# Patient Record
Sex: Female | Born: 1985 | Race: White | Hispanic: No | Marital: Married | Smoking: Never smoker
Health system: Southern US, Community
[De-identification: ages and names within clinical notes are randomized; demographics above are authoritative.]

## PROBLEM LIST (undated history)

## (undated) DIAGNOSIS — F419 Anxiety disorder, unspecified: Secondary | ICD-10-CM

## (undated) DIAGNOSIS — Z8619 Personal history of other infectious and parasitic diseases: Secondary | ICD-10-CM

## (undated) DIAGNOSIS — J45909 Unspecified asthma, uncomplicated: Secondary | ICD-10-CM

## (undated) DIAGNOSIS — R87629 Unspecified abnormal cytological findings in specimens from vagina: Secondary | ICD-10-CM

## (undated) DIAGNOSIS — F329 Major depressive disorder, single episode, unspecified: Secondary | ICD-10-CM

## (undated) DIAGNOSIS — G43909 Migraine, unspecified, not intractable, without status migrainosus: Secondary | ICD-10-CM

## (undated) DIAGNOSIS — Z5189 Encounter for other specified aftercare: Secondary | ICD-10-CM

## (undated) DIAGNOSIS — D649 Anemia, unspecified: Secondary | ICD-10-CM

## (undated) DIAGNOSIS — J4530 Mild persistent asthma, uncomplicated: Secondary | ICD-10-CM

## (undated) DIAGNOSIS — S060X9A Concussion with loss of consciousness of unspecified duration, initial encounter: Secondary | ICD-10-CM

## (undated) DIAGNOSIS — S060XAA Concussion with loss of consciousness status unknown, initial encounter: Secondary | ICD-10-CM

## (undated) DIAGNOSIS — F32A Depression, unspecified: Secondary | ICD-10-CM

## (undated) HISTORY — DX: Anxiety disorder, unspecified: F41.9

## (undated) HISTORY — PX: LUMBAR PUNCTURE: SHX1985

## (undated) HISTORY — DX: Mild persistent asthma, uncomplicated: J45.30

## (undated) HISTORY — DX: Concussion with loss of consciousness status unknown, initial encounter: S06.0XAA

## (undated) HISTORY — DX: Personal history of other infectious and parasitic diseases: Z86.19

## (undated) HISTORY — DX: Unspecified abnormal cytological findings in specimens from vagina: R87.629

## (undated) HISTORY — DX: Concussion with loss of consciousness of unspecified duration, initial encounter: S06.0X9A

## (undated) HISTORY — DX: Major depressive disorder, single episode, unspecified: F32.9

## (undated) HISTORY — DX: Migraine, unspecified, not intractable, without status migrainosus: G43.909

## (undated) HISTORY — DX: Anemia, unspecified: D64.9

---
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
## 2012-12-13 ENCOUNTER — Encounter: Payer: Self-pay | Admitting: *Deleted

## 2012-12-13 ENCOUNTER — Emergency Department
Admission: EM | Admit: 2012-12-13 | Discharge: 2012-12-13 | Disposition: A | Discharge status: Home / Self Care | Payer: BC Managed Care – PPO | Source: Home / Self Care

## 2012-12-13 ENCOUNTER — Emergency Department (INDEPENDENT_AMBULATORY_CARE_PROVIDER_SITE_OTHER): Payer: BC Managed Care – PPO

## 2012-12-13 DIAGNOSIS — S93401A Sprain of unspecified ligament of right ankle, initial encounter: Secondary | ICD-10-CM

## 2012-12-13 DIAGNOSIS — S93409A Sprain of unspecified ligament of unspecified ankle, initial encounter: Secondary | ICD-10-CM

## 2012-12-13 DIAGNOSIS — M25579 Pain in unspecified ankle and joints of unspecified foot: Secondary | ICD-10-CM

## 2012-12-13 HISTORY — DX: Unspecified asthma, uncomplicated: J45.909

## 2012-12-13 HISTORY — DX: Encounter for other specified aftercare: Z51.89

## 2012-12-13 MED ORDER — MELOXICAM 15 MG PO TABS: 15.0000 mg | ORAL_TABLET | Freq: Every day | ORAL | Status: DC

## 2012-12-13 NOTE — ED Notes (Signed)
Pt c/o RT ankle injury x 6 days ago. No OTC meds.

## 2012-12-13 NOTE — ED Provider Notes (Signed)
CSN: [id number]     Arrival date & time 12/13/12  1703 History     None    Chief Complaint  Patient presents with  . Ankle Injury    HPI   R ankle pain x 1 week  Pt accidentally rolled ankle last weekend.  Reinjured same ankle 1-2 days later.  Had some mild swelling and redness/bruising.  Pain predominantly medial sided Has been able to ambulate.   Past Medical History  Diagnosis Date  . Asthma   . Blood transfusion without reported diagnosis    Past Surgical History  Procedure Laterality Date  . Mouth surgery    . Lumbar puncture     Family History  Problem Relation Age of Onset  . Asthma Mother    History  Substance Use Topics  . Smoking status: Never Smoker   . Smokeless tobacco: Not on file  . Alcohol Use: Yes   OB History   Grav Para Term Preterm Abortions TAB SAB Ect Mult Living                 Review of Systems  All other systems reviewed and are negative.    Allergies  Influenza vaccines and Sulfa antibiotics  Home Medications   Current Outpatient Rx  Name  Route  Sig  Dispense  Refill  . albuterol (PROVENTIL HFA;VENTOLIN HFA) 108 (90 BASE) MCG/ACT inhaler   Inhalation   Inhale 2 puffs into the lungs every 6 (six) hours as needed for wheezing.         Marland Kitchen escitalopram (LEXAPRO) 10 MG tablet   Oral   Take 10 mg by mouth daily.         Marland Kitchen UNKNOWN TO PATIENT                BP 115/74  Pulse 71  Temp(Src) 98.1 F (36.7 C) (Oral)  Resp 16  Ht 5\' 6"  (1.676 m)  Wt 155 lb (70.308 kg)  BMI 25.03 kg/m2  SpO2 99%  LMP 11/29/2012 Physical Exam  Constitutional: She appears well-developed and well-nourished.  HENT:  Head: Normocephalic and atraumatic.  Eyes: Conjunctivae are normal. Pupils are equal, round, and reactive to light.  Neck: Normal range of motion.  Cardiovascular: Normal rate and regular rhythm.   Pulmonary/Chest: Effort normal and breath sounds normal.  Abdominal: Soft.  Musculoskeletal: Normal range of motion.   Feet:  Neurological: She is alert.    ED Course   Procedures (including critical care time)  Labs Reviewed - No data to display Dg Ankle Complete Right  12/13/2012   *RADIOLOGY REPORT*  Clinical Data: . Right ankle injury and pain  RIGHT ANKLE - COMPLETE 3+ VIEW  Comparison: None  Findings: There is no evidence of acute fracture, subluxation or dislocation. The ankle mortise is intact. The talar dome is unremarkable. No focal bony lesions are present. There is no evidence of ankle effusion.  IMPRESSION: No evidence of acute abnormality.   Original Report Authenticated By: Harmon Pier, M.D.   1. Right ankle sprain, initial encounter     MDM  xrays negative for fracture.  RICE and NSAIDs.  Ankle brace.  Discussed general care and MSK red flags. Follow up as needed.      The patient and/or caregiver has been counseled thoroughly with regard to treatment plan and/or medications prescribed including dosage, schedule, interactions, rationale for use, and possible side effects and they verbalize understanding. Diagnoses and expected course of recovery discussed and will return if not  improved as expected or if the condition worsens. Patient and/or caregiver verbalized understanding.        Doree Albee, MD 12/13/12 1800

## 2013-01-21 ENCOUNTER — Emergency Department
Admission: EM | Admit: 2013-01-21 | Discharge: 2013-01-21 | Disposition: A | Discharge status: Home / Self Care | Payer: BC Managed Care – PPO | Source: Home / Self Care | Attending: Family Medicine | Admitting: Family Medicine

## 2013-01-21 ENCOUNTER — Encounter: Payer: Self-pay | Admitting: *Deleted

## 2013-01-21 DIAGNOSIS — Z202 Contact with and (suspected) exposure to infections with a predominantly sexual mode of transmission: Secondary | ICD-10-CM

## 2013-01-21 DIAGNOSIS — N3000 Acute cystitis without hematuria: Secondary | ICD-10-CM

## 2013-01-21 DIAGNOSIS — R300 Dysuria: Secondary | ICD-10-CM

## 2013-01-21 DIAGNOSIS — Z9189 Other specified personal risk factors, not elsewhere classified: Secondary | ICD-10-CM

## 2013-01-21 HISTORY — DX: Anxiety disorder, unspecified: F41.9

## 2013-01-21 HISTORY — DX: Depression, unspecified: F32.A

## 2013-01-21 HISTORY — DX: Major depressive disorder, single episode, unspecified: F32.9

## 2013-01-21 LAB — POCT URINALYSIS DIP (MANUAL ENTRY)
Protein Ur, POC: 30
Spec Grav, UA: 1.025 (ref 1.005–1.03)
Urobilinogen, UA: 1 (ref 0–1)

## 2013-01-21 MED ORDER — NITROFURANTOIN MONOHYD MACRO 100 MG PO CAPS: 100.0000 mg | ORAL_CAPSULE | Freq: Two times a day (BID) | ORAL | Status: DC

## 2013-01-21 NOTE — ED Notes (Signed)
Pt c/o dysuria and frequent urination x 1 day. Denies d/c. She reports that she did have unprotected sex this weekend.

## 2013-01-21 NOTE — ED Provider Notes (Signed)
CSN: [id number]     Arrival date & time 01/21/13  1755 History   First MD Initiated Contact with Patient 01/21/13 1819     Chief Complaint  Patient presents with  . Urinary Frequency  . Abdominal Pain     HPI Comments: Patient complains of dysuria and frequency for two days.  No nocturia, abdominal pain, nausea, or fever.  No pelvic pain or vaginal discharge.  She had unprotected intercourse 3 days ago and is concerned about the possibility of STD.   Patient's last menstrual period was 12/22/2012.   Patient is a 27 y.o. female presenting with dysuria. The history is provided by the patient.  Dysuria Pain quality:  Burning Pain severity:  Mild Onset quality:  Sudden Duration:  2 days Timing:  Constant Progression:  Worsening Chronicity:  New Recent urinary tract infections: no   Relieved by:  Nothing Worsened by:  Nothing tried Ineffective treatments:  None tried Urinary symptoms: foul-smelling urine, frequent urination and hesitancy   Urinary symptoms: no discolored urine, no hematuria and no bladder incontinence   Associated symptoms: no abdominal pain, no fever, no flank pain, no genital lesions, no nausea, no vaginal discharge and no vomiting   Risk factors: sexually active     Past Medical History  Diagnosis Date  . Asthma   . Blood transfusion without reported diagnosis   . Anxiety   . Depression    Past Surgical History  Procedure Laterality Date  . Mouth surgery    . Lumbar puncture     Family History  Problem Relation Age of Onset  . Asthma Mother    History  Substance Use Topics  . Smoking status: Never Smoker   . Smokeless tobacco: Not on file  . Alcohol Use: Yes   OB History   Grav Para Term Preterm Abortions TAB SAB Ect Mult Living                 Review of Systems  Constitutional: Negative for fever.  Gastrointestinal: Negative for nausea, vomiting and abdominal pain.  Genitourinary: Positive for dysuria. Negative for flank pain and vaginal  discharge.  All other systems reviewed and are negative.    Allergies  Influenza vaccines and Sulfa antibiotics  Home Medications   Current Outpatient Rx  Name  Route  Sig  Dispense  Refill  . albuterol (PROVENTIL HFA;VENTOLIN HFA) 108 (90 BASE) MCG/ACT inhaler   Inhalation   Inhale 2 puffs into the lungs every 6 (six) hours as needed for wheezing.         Marland Kitchen escitalopram (LEXAPRO) 10 MG tablet   Oral   Take 10 mg by mouth daily.         . meloxicam (MOBIC) 15 MG tablet   Oral   Take 1 tablet (15 mg total) by mouth daily.   30 tablet   1   . nitrofurantoin, macrocrystal-monohydrate, (MACROBID) 100 MG capsule   Oral   Take 1 capsule (100 mg total) by mouth 2 (two) times daily.   14 capsule   0   . UNKNOWN TO PATIENT                BP 123/82  Pulse 82  Temp(Src) 98.2 F (36.8 C) (Oral)  Resp 16  Ht 5\' 6"  (1.676 m)  Wt 154 lb (69.854 kg)  BMI 24.87 kg/m2  SpO2 100%  LMP 12/22/2012 Physical Exam Nursing notes and Vital Signs reviewed. Appearance:  Patient appears healthy, stated age, and in  no acute distress Eyes:  Pupils are equal, round, and reactive to light and accomodation.  Extraocular movement is intact.  Conjunctivae are not inflamed  Pharynx:  Normal Neck:  Supple.  No adenopathy Lungs:  Clear to auscultation.  Breath sounds are equal.  Heart:  Regular rate and rhythm without murmurs, rubs, or gallops.  Abdomen:  Nontender without masses or hepatosplenomegaly.  Bowel sounds are present.  No CVA or flank tenderness.  Extremities:  No edema.  No calf tenderness Skin:  No rash present.   ED Course  Procedures  None  Labs Review Labs Reviewed  URINE CULTURE  GC/CHLAMYDIA PROBE AMP, GENITAL  HIV ANTIBODY (ROUTINE TESTING)  POCT URINALYSIS DIP (MANUAL ENTRY) BLO trace intact; PRO 30mg /dL; LEU moderate       MDM   1. Dysuria   2. Acute cystitis   3. Possible exposure to STD    GC/chlamydia pending.  Patient feels there no risk of HIV  exposure this past week.  Will check HIV antibodies (last HIV testing about one year ago)  Urine culture pending.  Begin Macrobid. Increase fluid intake.  May take AZO for two days if desired.    Lattie Haw, MD 01/25/13 1900

## 2013-01-22 LAB — GC/CHLAMYDIA PROBE AMP
CT Probe RNA: NEGATIVE
GC Probe RNA: NEGATIVE

## 2013-01-23 ENCOUNTER — Telehealth: Payer: Self-pay | Admitting: *Deleted

## 2013-05-12 ENCOUNTER — Ambulatory Visit (INDEPENDENT_AMBULATORY_CARE_PROVIDER_SITE_OTHER): Payer: BC Managed Care – PPO | Admitting: Family Medicine

## 2013-05-12 ENCOUNTER — Encounter: Payer: Self-pay | Admitting: Family Medicine

## 2013-05-12 VITALS — BP 120/78 | HR 89 | Temp 98.1°F | Ht 66.0 in | Wt 158.0 lb

## 2013-05-12 DIAGNOSIS — F419 Anxiety disorder, unspecified: Secondary | ICD-10-CM

## 2013-05-12 DIAGNOSIS — F32A Depression, unspecified: Secondary | ICD-10-CM | POA: Insufficient documentation

## 2013-05-12 DIAGNOSIS — J45901 Unspecified asthma with (acute) exacerbation: Secondary | ICD-10-CM

## 2013-05-12 DIAGNOSIS — J4530 Mild persistent asthma, uncomplicated: Secondary | ICD-10-CM

## 2013-05-12 DIAGNOSIS — F329 Major depressive disorder, single episode, unspecified: Secondary | ICD-10-CM | POA: Insufficient documentation

## 2013-05-12 DIAGNOSIS — F341 Dysthymic disorder: Secondary | ICD-10-CM

## 2013-05-12 HISTORY — DX: Mild persistent asthma, uncomplicated: J45.30

## 2013-05-12 HISTORY — DX: Depression, unspecified: F32.A

## 2013-05-12 HISTORY — DX: Anxiety disorder, unspecified: F41.9

## 2013-05-12 MED ORDER — ESCITALOPRAM OXALATE 10 MG PO TABS: 10.0000 mg | ORAL_TABLET | Freq: Every day | ORAL | Status: DC

## 2013-05-12 MED ORDER — DOXYCYCLINE HYCLATE 100 MG PO TABS: ORAL_TABLET | ORAL | Status: DC

## 2013-05-12 MED ORDER — PREDNISONE 20 MG PO TABS: ORAL_TABLET | ORAL | Status: DC

## 2013-05-12 MED ORDER — PREDNISONE 20 MG PO TABS: ORAL_TABLET | ORAL | Status: AC

## 2013-05-12 NOTE — Progress Notes (Signed)
CC: Kim Rodriguez is a 27 y.o. 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 Symptoms are slightly improved with albuterol.  Has also tried humidifier without much benefit at home. No other interventions as of yet. Reports a history of long-standing asthma has been on controller medications in the past but admits to trying to use them as infrequently as possible.  Cough is described as nonproductive with little to no wheeze. Accompanied by shortness of breath which is mild in severity and chest tightness which is not related to exertion.  Complains of remote history of anxiety and depression that was worsened about a year ago after the passing of her father. She stopped taking Lexapro approximately 2-3 months ago to see if she needed to really be on. Since then her husband and friends have noticed she has a more gloomy outlook on life and has significantly lost interest in social activity. Patient denies thoughts of wanting to harm self or others but does agree with others observations described above. Reports objective depression mild to moderate in severity.  Review of Systems - General ROS: negative for - chills, fever, night sweats, weight gain or weight loss Ophthalmic ROS: negative for - decreased vision Psychological ROS: negative for - paranoia ENT ROS: negative for - hearing change, nasal congestion, tinnitus or allergies Hematological and Lymphatic ROS: negative for - bleeding problems, bruising or swollen lymph nodes Breast ROS: negative Respiratory ROS: Positive for cough shortness of breath and wheezing Cardiovascular ROS: Denies exertional chest pain Gastrointestinal ROS: no abdominal pain, change in bowel habits, or black or bloody  stools Genito-Urinary ROS: negative for - genital discharge, genital ulcers, incontinence or abnormal bleeding from genitals Musculoskeletal ROS: negative for - joint pain or muscle pain Neurological ROS: negative for - headaches or memory loss Dermatological ROS: negative for lumps, mole changes, rash and skin lesion changes  Past Medical History  Diagnosis Date  . Asthma   . Blood transfusion without reported diagnosis   . Anxiety   . Depression   . Asthma, mild persistent 05/12/2013  . Anxiety and depression 05/12/2013     Family History  Problem Relation Age of Onset  . Asthma Mother   . Skin cancer Mother      History  Substance Use Topics  . Smoking status: Never Smoker   . Smokeless tobacco: Not on file  . Alcohol Use: 0.0 oz/week    0 drink(s) per week     Comment: 1 drink a month     Objective: Filed Vitals:   05/12/13 1311  BP: 120/78  Pulse: 89  Temp: 98.1 F (36.7 C)    General: Alert and Oriented, No Acute Distress HEENT: Pupils equal, round, reactive to light. Conjunctivae clear.  External ears unremarkable, canals clear with intact TMs with appropriate landmarks.  Middle ear appears open without effusion. Pink inferior turbinates.  Moist mucous membranes, pharynx without inflammation nor lesions.  Neck supple without palpable lymphadenopathy nor abnormal masses. Lungs: Clear to auscultation bilaterally, no rhonchi or rails. There is trace end expiratory wheezing. Comfortable work of breathing. Good air movement. Cardiac: Regular rate and rhythm. Normal S1/S2.  No murmurs, rubs, nor gallops.   Mental Status: No depression, anxiety, nor agitation. Skin:  Warm and dry.  Assessment & Plan: Kim Rodriguez was seen today for establish care and cough.  Diagnoses and associated orders for this visit:  Anxiety and depression - Discontinue: escitalopram (LEXAPRO) 10 MG tablet; Take 1 tablet (10 mg total) by mouth daily. - escitalopram (LEXAPRO) 10 MG tablet; Take 1  tablet (10 mg total) by mouth daily.  Asthma, mild persistent  Asthma with acute exacerbation - Discontinue: doxycycline (VIBRA-TABS) 100 MG tablet; One by mouth twice a day for ten days. - Discontinue: predniSONE (DELTASONE) 20 MG tablet; Three tabs at once daily for five days. - doxycycline (VIBRA-TABS) 100 MG tablet; One by mouth twice a day for ten days. - predniSONE (DELTASONE) 20 MG tablet; Three tabs at once daily for five days.    Anxiety and depression: Uncontrolled restart Lexapro followup 1 month Asthma: Uncontrolled due to acute exacerbation therefore start prednisone and doxycycline. Return once feeling better from a pulmonary standpoint to discuss controller medication such as ICS  Return in about 4 weeks (around 06/09/2013).

## 2013-06-15 ENCOUNTER — Emergency Department
Admission: EM | Admit: 2013-06-15 | Discharge: 2013-06-15 | Disposition: A | Discharge status: Home / Self Care | Payer: 59 | Source: Home / Self Care | Attending: Family Medicine | Admitting: Family Medicine

## 2013-06-15 ENCOUNTER — Encounter: Payer: Self-pay | Admitting: Emergency Medicine

## 2013-06-15 DIAGNOSIS — R69 Illness, unspecified: Principal | ICD-10-CM

## 2013-06-15 DIAGNOSIS — J111 Influenza due to unidentified influenza virus with other respiratory manifestations: Secondary | ICD-10-CM

## 2013-06-15 MED ORDER — BENZONATATE 200 MG PO CAPS: 200.0000 mg | ORAL_CAPSULE | Freq: Every day | ORAL | Status: DC

## 2013-06-15 MED ORDER — OSELTAMIVIR PHOSPHATE 75 MG PO CAPS: 75.0000 mg | ORAL_CAPSULE | Freq: Two times a day (BID) | ORAL | Status: DC

## 2013-06-15 NOTE — ED Notes (Signed)
Kim Rodriguez complains of body aches, dizziness, eye pain, headaches, runny nose, sneezing, congestion, cough, shortness on breath and nausea for 3 days. Denies fever, chills or sweats. Kim Rodriguez does however state a worsening of symptoms daily.

## 2013-06-15 NOTE — Discharge Instructions (Signed)
Take plain Mucinex (1200 mg guaifenesin) twice daily for cough and congestion.  May add Sudafed for sinus congestion.   Increase fluid intake, rest. May use Afrin nasal spray (or generic oxymetazoline) twice daily for about 5 days.  Also recommend using saline nasal spray several times daily and saline nasal irrigation (AYR is a common brand) Continue albuterol inhaler as needed. Try warm salt water gargles for sore throat.  Stop all antihistamines for now, and other non-prescription cough/cold preparations. May take Ibuprofen 200mg , 4 tabs every 8 hours with food for chest/sternum discomfort, fever, etc.     Influenza, Adult Influenza ("the flu") is a viral infection of the respiratory tract. It occurs more often in winter months because people spend more time in close contact with one another. Influenza can make you feel very sick. Influenza easily spreads from person to person (contagious). CAUSES  Influenza is caused by a virus that infects the respiratory tract. You can catch the virus by breathing in droplets from an infected person's cough or sneeze. You can also catch the virus by touching something that was recently contaminated with the virus and then touching your mouth, nose, or eyes. SYMPTOMS  Symptoms typically last 4 to 10 days and may include:  Fever.  Chills.  Headache, body aches, and muscle aches.  Sore throat.  Chest discomfort and cough.  Poor appetite.  Weakness or feeling tired.  Dizziness.  Nausea or vomiting. DIAGNOSIS  Diagnosis of influenza is often made based on your history and a physical exam. A nose or throat swab test can be done to confirm the diagnosis. RISKS AND COMPLICATIONS You may be at risk for a more severe case of influenza if you smoke cigarettes, have diabetes, have chronic heart disease (such as heart failure) or lung disease (such as asthma), or if you have a weakened immune system. Elderly people and pregnant women are also at risk for  more serious infections. The most common complication of influenza is a lung infection (pneumonia). Sometimes, this complication can require emergency medical care and may be life-threatening. PREVENTION  An annual influenza vaccination (flu shot) is the best way to avoid getting influenza. An annual flu shot is now routinely recommended for all adults in the [place] TREATMENT  In mild cases, influenza goes away on its own. Treatment is directed at relieving symptoms. For more severe cases, your caregiver may prescribe antiviral medicines to shorten the sickness. Antibiotic medicines are not effective, because the infection is caused by a virus, not by bacteria. HOME CARE INSTRUCTIONS  Only take over-the-counter or prescription medicines for pain, discomfort, or fever as directed by your caregiver.  Use a cool mist humidifier to make breathing easier.  Get plenty of rest until your temperature returns to normal. This usually takes 3 to 4 days.  Drink enough fluids to keep your urine clear or pale yellow.  Cover your mouth and nose when coughing or sneezing, and wash your hands well to avoid spreading the virus.  Stay home from work or school until your fever has been gone for at least 1 full day. SEEK MEDICAL CARE IF:   You have chest pain or a deep cough that worsens or produces more mucus.  You have nausea, vomiting, or diarrhea. SEEK IMMEDIATE MEDICAL CARE IF:   You have difficulty breathing, shortness of breath, or your skin or nails turn bluish.  You have severe neck pain or stiffness.  You have a severe headache, facial pain, or earache.  You have  a worsening or recurring fever.  You have nausea or vomiting that cannot be controlled. MAKE SURE YOU:  Understand these instructions.  Will watch your condition.  Will get help right away if you are not doing well or get worse. Document Released: 04/28/2000 Document Revised: 10/31/2011 Document Reviewed: 07/31/2011 [facility]  Patient Information 2014 [place], [place].

## 2013-06-15 NOTE — ED Provider Notes (Signed)
CSN: [id number]     Arrival date & time 06/15/13  1541 History   First MD Initiated Contact with Patient 06/15/13 1600     Chief Complaint  Patient presents with  . Generalized Body Aches    x 3 days  . Headache    x 3 days  . Cough    x 3 days      HPI Comments: Three days ago patient nasal congestion and fatigue.  The next day she developed myalgias, chills/sweats, cough, nasal congestion, nausea, and anorexia.  The history is provided by the patient.    Past Medical History  Diagnosis Date  . Asthma   . Blood transfusion without reported diagnosis   . Anxiety   . Depression   . Asthma, mild persistent 05/12/2013  . Anxiety and depression 05/12/2013   Past Surgical History  Procedure Laterality Date  . Mouth surgery  2008  . Lumbar puncture      pt had a blood transfusion in 1991   Family History  Problem Relation Age of Onset  . Asthma Mother   . Skin cancer Mother   . Hyperlipidemia Mother    History  Substance Use Topics  . Smoking status: Never Smoker   . Smokeless tobacco: Not on file  . Alcohol Use: 0.0 oz/week    0 drink(s) per week     Comment: 1 drink a month   OB History   Grav Para Term Preterm Abortions TAB SAB Ect Mult Living                 Review of Systems No sore throat + cough No pleuritic pain No wheezing + nasal congestion + post-nasal drainage No sinus pain/pressure ? itchy/red eyes No earache No hemoptysis + SOB No fever, + chills + nausea No vomiting No abdominal pain No diarrhea No urinary symptoms No skin rash + fatigue + myalgias + headache Used OTC meds without relief  Allergies  Influenza vaccines and Sulfa antibiotics  Home Medications   Current Outpatient Rx  Name  Route  Sig  Dispense  Refill  . albuterol (PROVENTIL HFA;VENTOLIN HFA) 108 (90 BASE) MCG/ACT inhaler   Inhalation   Inhale 2 puffs into the lungs every 6 (six) hours as needed for wheezing.         . benzonatate (TESSALON) 200 MG capsule   Oral   Take 1 capsule (200 mg total) by mouth at bedtime. Take as needed for cough   12 capsule   0   . escitalopram (LEXAPRO) 10 MG tablet   Oral   Take 1 tablet (10 mg total) by mouth daily.   90 tablet   1   . oseltamivir (TAMIFLU) 75 MG capsule   Oral   Take 1 capsule (75 mg total) by mouth every 12 (twelve) hours.   10 capsule   0    BP 130/82  Pulse 108  Temp(Src) 98 F (36.7 C) (Oral)  Ht 5\' 6"  (1.676 m)  Wt 156 lb (70.761 kg)  BMI 25.19 kg/m2  SpO2 97%  LMP 05/27/2013 Physical Exam Nursing notes and Vital Signs reviewed. Appearance:  Patient appears healthy, stated age, and in no acute distress Eyes:  Pupils are equal, round, and reactive to light and accomodation.  Extraocular movement is intact.  Conjunctivae are not inflamed  Ears:  Canals normal.  Tympanic membranes normal.  Nose:  Mildly congested turbinates.  No sinus tenderness.    Pharynx:  Normal Neck:  Supple.  Tender shotty posterior nodes are palpated bilaterally  Lungs:  Clear to auscultation.  Breath sounds are equal. Chest:  Distinct tenderness to palpation over the mid-sternum.   Heart:  Regular rate and rhythm without murmurs, rubs, or gallops.  Abdomen:  Nontender without masses or hepatosplenomegaly.  Bowel sounds are present.  No CVA or flank tenderness.  Extremities:  No edema.  No calf tenderness Skin:  No rash present.   ED Course  Procedures  none       MDM   1. Influenza-like illness    Begin Tamiflu.  Prescription written for Benzonatate [facility](Tessalon) to take at bedtime for night-time cough.  Take plain Mucinex (1200 mg guaifenesin) twice daily for cough and congestion.  May add Sudafed for sinus congestion.   Increase fluid intake, rest. May use Afrin nasal spray (or generic oxymetazoline) twice daily for about 5 days.  Also recommend using saline nasal spray several times daily and saline nasal irrigation (AYR is a common brand) Continue albuterol inhaler as needed. Try warm  salt water gargles for sore throat.  Stop all antihistamines for now, and other non-prescription cough/cold preparations. May take Ibuprofen 200mg , 4 tabs every 8 hours with food for chest/sternum discomfort, fever, etc. Followup with Family Doctor if not improved in 5 days.    Lattie HawStephen A Jerral Mccauley, MD 06/16/13 [id number]

## 2013-07-02 ENCOUNTER — Encounter: Payer: Self-pay | Admitting: Emergency Medicine

## 2013-07-02 ENCOUNTER — Emergency Department
Admission: EM | Admit: 2013-07-02 | Discharge: 2013-07-02 | Disposition: A | Discharge status: Home / Self Care | Payer: 59 | Source: Home / Self Care | Attending: Family Medicine | Admitting: Family Medicine

## 2013-07-02 DIAGNOSIS — H579 Unspecified disorder of eye and adnexa: Secondary | ICD-10-CM

## 2013-07-02 NOTE — Discharge Instructions (Signed)
Apply cool compress several times daily

## 2013-07-02 NOTE — ED Provider Notes (Signed)
CSN: [id number]     Arrival date & time 07/02/13  1711 History   First MD Initiated Contact with Patient 07/02/13 1826     Chief Complaint  Patient presents with  . Eye Problem        HPI Comments: Patient noticed a vague foreign body sensation in her right eye last night, and she has had some mucoid discharge present.  She denies redness.  Patient is a 28 y.o. female presenting with eye problem. The history is provided by the patient.  Eye Problem Quality:  Foreign body sensation Severity:  Mild Onset quality:  Sudden Duration:  1 day Timing:  Constant Progression:  Unchanged Chronicity:  New Relieved by:  Nothing Worsened by:  Eye movement Ineffective treatments:  None tried Associated symptoms: discharge and foreign body sensation   Associated symptoms: no blurred vision, no crusting, no decreased vision, no double vision, no facial rash, no headaches, no inflammation, no itching, no nausea, no photophobia, no redness, no scotomas, no swelling and no tearing   Risk factors: not exposed to pinkeye and no recent URI     Past Medical History  Diagnosis Date  . Asthma   . Blood transfusion without reported diagnosis   . Anxiety   . Depression   . Asthma, mild persistent 05/12/2013  . Anxiety and depression 05/12/2013   Past Surgical History  Procedure Laterality Date  . Mouth surgery  2008  . Lumbar puncture      pt had a blood transfusion in 1991   Family History  Problem Relation Age of Onset  . Asthma Mother   . Skin cancer Mother   . Hyperlipidemia Mother    History  Substance Use Topics  . Smoking status: Never Smoker   . Smokeless tobacco: Not on file  . Alcohol Use: No     Comment: 1 drink a month   OB History   Grav Para Term Preterm Abortions TAB SAB Ect Mult Living                 Review of Systems  Eyes: Positive for discharge. Negative for blurred vision, double vision, photophobia, redness and itching.  Gastrointestinal: Negative for nausea.   Neurological: Negative for headaches.      Allergies  Influenza vaccines and Sulfa antibiotics  Home Medications   Current Outpatient Rx  Name  Route  Sig  Dispense  Refill  . albuterol (PROVENTIL HFA;VENTOLIN HFA) 108 (90 BASE) MCG/ACT inhaler   Inhalation   Inhale 2 puffs into the lungs every 6 (six) hours as needed for wheezing.         . benzonatate (TESSALON) 200 MG capsule   Oral   Take 1 capsule (200 mg total) by mouth at bedtime. Take as needed for cough   12 capsule   0   . escitalopram (LEXAPRO) 10 MG tablet   Oral   Take 1 tablet (10 mg total) by mouth daily.   90 tablet   1   . oseltamivir (TAMIFLU) 75 MG capsule   Oral   Take 1 capsule (75 mg total) by mouth every 12 (twelve) hours.   10 capsule   0    BP 116/85  Pulse 97  Temp(Src) 98 F (36.7 C) (Oral)  Resp 16  Ht 5\' 8"  (1.727 m)  Wt 157 lb (71.215 kg)  BMI 23.88 kg/m2  SpO2 100%  LMP 06/30/2013 Physical Exam Nursing notes and Vital Signs reviewed. Appearance:  Patient appears healthy, stated age,  and in no acute distress Eyes:  Pupils are equal, round, and reactive to light and accomodation.  Extraocular movement is intact.  Conjunctivae are not inflamed.  No photophobia.  Fundi benign.  No lid swelling or tenderness.  Tetracaine ophthalmic suspension instilled to right eye.  Patient will not allow complete eversion of upper eyelid but area visualized reveals no foreign body.  Fluorescein shows no uptake.  Skin:  No rash present.   ED Course  Procedures  none       MDM   Final diagnoses:  Sensation of foreign body in eye.  Patient will not allow thorough eye exam.  Limited exam, including fluorescein application, reveals no foreign body or corneal abrasion/defect.    Apply cool compress several times daily. Followup with ophthalmologist if not improved 3 days    Lattie Haw, MD 07/04/13 [phone]

## 2013-07-02 NOTE — ED Notes (Signed)
Pt c/o RT eye pain with sticky drainage x last night. Denies itching and redness.

## 2013-07-03 ENCOUNTER — Encounter: Payer: Self-pay | Admitting: Sports Medicine

## 2013-07-03 ENCOUNTER — Ambulatory Visit (INDEPENDENT_AMBULATORY_CARE_PROVIDER_SITE_OTHER): Payer: 59 | Admitting: Sports Medicine

## 2013-07-03 ENCOUNTER — Telehealth: Payer: Self-pay | Admitting: *Deleted

## 2013-07-03 ENCOUNTER — Ambulatory Visit (INDEPENDENT_AMBULATORY_CARE_PROVIDER_SITE_OTHER): Payer: 59

## 2013-07-03 VITALS — BP 123/74 | HR 93 | Ht 66.0 in | Wt 157.0 lb

## 2013-07-03 DIAGNOSIS — H00039 Abscess of eyelid unspecified eye, unspecified eyelid: Secondary | ICD-10-CM

## 2013-07-03 DIAGNOSIS — H5789 Other specified disorders of eye and adnexa: Secondary | ICD-10-CM

## 2013-07-03 DIAGNOSIS — L03213 Periorbital cellulitis: Secondary | ICD-10-CM

## 2013-07-03 MED ORDER — DOXYCYCLINE HYCLATE 100 MG PO TABS: 100.0000 mg | ORAL_TABLET | Freq: Two times a day (BID) | ORAL | Status: DC

## 2013-07-03 MED ORDER — IOHEXOL 300 MG/ML  SOLN
80.0000 mL | Freq: Once | INTRAMUSCULAR | Status: AC | PRN
  Administered 2013-07-03: 80 mL via INTRAVENOUS

## 2013-07-03 NOTE — Progress Notes (Signed)
  Subjective:    CC: Right eye swelling  HPI: Irving Burtonmily is a very pleasant 28 year old female, for the past several days she's had increasing pain and swelling that she localizes around her right eye, she has not noticed any injection or conjunctiva. Symptoms are moderate, worsening. She did have a preceding sinus infection.  Past medical history, Surgical history, Family history not pertinant except as noted below, Social history, Allergies, and medications have been entered into the medical record, reviewed, and no changes needed.   Review of Systems: No fevers, chills, night sweats, weight loss, chest pain, or shortness of breath.   Objective:    General: Well Developed, well nourished, and in no acute distress.  Neuro: Alert and oriented x3, extra-ocular muscles intact, sensation grossly intact.  HEENT: Normocephalic, atraumatic, pupils equal round reactive to light, neck supple, no masses, no lymphadenopathy, thyroid nonpalpable. The lids are swollen and erythematous around the right eye, there is no conjunctival injection, she does have significant pain with motion of her for ocular muscles. Skin: Warm and dry, no rashes. Cardiac: Regular rate and rhythm, no murmurs rubs or gallops, no lower extremity edema.  Respiratory: Clear to auscultation bilaterally. Not using accessory muscles, speaking in full sentences.  CT scan was personally reviewed and does show preseptal cellulitis but no evidence of orbital cellulitis or collection.  Impression and Recommendations:

## 2013-07-03 NOTE — Assessment & Plan Note (Signed)
My suspicion is for preseptal cellulitis which we will treat with 14 days of doxycycline, she did have the classic preceding upper respiratory infection, this likely seeded from her sinuses. Unfortunately she does have significant pain with extraocular motion which makes is worried about a orbital cellulitis. I am going to obtain a stat maxillofacial CT scan with IV contrast.

## 2013-07-03 NOTE — Telephone Encounter (Signed)
Called [employer] and no precert required for CT Orbits w/ contrast.  Victorino DikeJennifer notified.  Pt sent to imaging to have stat CT.  Barry DienesKimberly Kilan Banfill, LPN

## 2013-07-03 NOTE — Patient Instructions (Signed)
Orbital Cellulitis   Orbital cellulitis is an infection of the soft tissue of the orbit without abscess formation. The eye socket is the area around and behind the eye. It usually comes on suddenly in children, but can happen at any age. This condition can lead to death if untreated.   CAUSES    A germ (bacterial) infection of the area around and behind the eye.   Long-term (chronic) sinus infections.   An object (foreign body) stuck behind the eye.   An injury that goes through (penetrates) to tissues of the eyelids.   Trauma with secondary infection.   Fracture of the boney wall or floor of the orbit.   Serious eyelid infections.   Bite wounds.   Inflammation or infection of the lining membranes of the brain (meningitis).   Blood poisoning or infection (septicemia).   Dental area filled with pus (abscesses).   Severe uncontrolled diabetes (ketoacidosis).  SYMPTOMS    Pain in the eye.   Redness and puffiness (swelling) of the eyelids. The swelling is often bad enough that the eye has to close.   Fever and feeling generally ill.   The lids and the cheek may be very red, hot and swollen.   A drop in vision.   Pain when touching the area around the eye.   The eye itself may look like it is "popping out" (proptosis).   Double vision  seeing two of everything (diplopia).  DIAGNOSIS   An ophthalmologist can tell you if you have orbital cellulitis during an eye exam. It is important to know if the infection goes into the area behind the eye. True orbital cellulitis is a medical emergency. A CT scan may be needed to see if the sinuses are involved. A CT scan can also see if an abscess has formed behind the eye.  TREATMENT   Orbital cellulitis should be treated in the hospital with medicine that kills germs (antibiotics). These antibiotics are given right into the bloodstream through a vein (intravenous).  SEEK IMMEDIATE MEDICAL CARE IF:    You have red, swollen eyelids.   You have a fever.   You  develop double vision.  MAKE SURE YOU:    Understand these instructions.   Will watch your condition.   Will get help right away if you are not doing well or get worse.  Document Released: 04/25/2001 Document Revised: 07/24/2011 Document Reviewed: 08/26/2007  [employer] Patient Information 2014 [employer].

## 2013-07-04 ENCOUNTER — Telehealth: Payer: Self-pay

## 2013-07-04 NOTE — Telephone Encounter (Signed)
Patient called stated that her eye is swollen worse than it was yesterday she can open it but not as wide as yesterday she wants to know what she should do at this point. Do patient need to come back in to have it looked at? Carina Chaplin,CMA

## 2013-07-04 NOTE — Telephone Encounter (Signed)
She just started the antibiotic less than 24h ago, give it another few days!  CT confirms nothing serious.

## 2013-07-04 NOTE — Telephone Encounter (Signed)
Spoke to patient advised her to let the antibiotic kick in for a few days and call back if no better. Rhonda Cunningham,CMA

## 2013-07-10 ENCOUNTER — Ambulatory Visit: Payer: 59 | Admitting: Sports Medicine

## 2013-09-16 ENCOUNTER — Ambulatory Visit (INDEPENDENT_AMBULATORY_CARE_PROVIDER_SITE_OTHER): Payer: 59 | Admitting: Internal Medicine

## 2013-09-16 ENCOUNTER — Encounter: Payer: Self-pay | Admitting: Internal Medicine

## 2013-09-16 VITALS — BP 106/80 | HR 82 | Temp 98.2°F | Ht 66.0 in | Wt 158.8 lb

## 2013-09-16 DIAGNOSIS — J45909 Unspecified asthma, uncomplicated: Secondary | ICD-10-CM

## 2013-09-16 DIAGNOSIS — J4530 Mild persistent asthma, uncomplicated: Secondary | ICD-10-CM

## 2013-09-16 MED ORDER — FAMOTIDINE 20 MG PO TABS
ORAL_TABLET | ORAL | Status: DC
Start: 2013-09-16 — End: 2014-05-28

## 2013-09-16 MED ORDER — MOMETASONE FURO-FORMOTEROL FUM 100-5 MCG/ACT IN AERO
INHALATION_SPRAY | RESPIRATORY_TRACT | Status: DC
Start: 2013-09-16 — End: 2015-06-03

## 2013-09-16 NOTE — Patient Instructions (Addendum)
Take pepcid 20 mg one at bedtime   Dulera 100 Take 2 puffs first thing in am and then another 2 puffs about 12 hours later.   GERD (REFLUX)  is an extremely common cause of respiratory symptoms, many times with no significant heartburn at all.    It can be treated with medication, but also with lifestyle changes including avoidance of late meals, excessive alcohol, smoking cessation, and avoid fatty foods, chocolate, peppermint, colas, red wine, and acidic juices such as orange juice.  NO MINT OR MENTHOL PRODUCTS SO NO COUGH DROPS  USE SUGARLESS CANDY INSTEAD (jolley ranchers or Pharmacologisttover's and Life savers) NO OIL BASED VITAMINS - use powdered substitutes.  Please schedule a follow up office visit in 2 weeks, sooner if needed

## 2013-09-16 NOTE — Progress Notes (Signed)
Subjective:    Patient ID: Kim Rodriguez, female    DOB: 04/30/1986   MRN: [id number]  HPI   [age] yowf never smoker with poor exercise tolerance starting in grade school eval only by FP's with neg w/u and never tried inhalers then HS couldn't run hills so couldn't play softball in college developed seasonal (spring) nasal congestion, pressure rx zyrtec helped some then persistent  noct cough age 28 then age 28 daytime cough > pulmonary [facility] Nov 2013 > POS MCT per pt allergy eval all trees, grasses rx shots ? Helped but didn't stick with them and tried 2 different inhalers one of which was proair and another hfa controller but didn't use it consistently with pattern of persistent nasal congestion worse in am and worse in spring does not remember pred rx ever and presented as a self referral to pulmonary clinic for the first time on 09/16/13   09/16/2013 1st  Pulmonary office visit/ Nahum Sherrer  Chief Complaint  Patient presents with  . Pulmonary Consult    Self referral for eval of Asthma. Pt states that she was dxed in Nov 2013.  She states having SOB, CP and cough since "always". Her cough is non prod.   chest tightness always transiently better with proair / really stuffy nose each am/ noct cough starting around 8 pm and lasting all night but does not keep her up / some nose bleeds.   Has been told she has gerd.  Her chest symptoms don't parallel her nasal symptoms in terms of severity/seasonality.  No obvious  patterns in day to day or daytime variabilty or assoc  cp or  Subjective wheeze or Overt  hb symptoms. No unusual exp hx or h/o childhood pna/ asthma or knowledge of premature birth.  Sleeping ok  s early am exacerbation  of respiratory  c/o's or need for noct saba. Also denies any obvious fluctuation of symptoms with weather or environmental changes or other aggravating or alleviating factors except as outlined above   Current Medications, Allergies, Complete Past Medical  History, Past Surgical History, Family History, and Social History were reviewed in [facility] electronic medical record.            Review of Systems  Constitutional: Negative for fever, chills and unexpected weight change.  HENT: Negative for congestion, dental problem, ear pain, nosebleeds, postnasal drip, rhinorrhea, sinus pressure, sneezing, sore throat, trouble swallowing and voice change.   Eyes: Negative for visual disturbance.  Respiratory: Positive for shortness of breath. Negative for cough and choking.   Cardiovascular: Negative for chest pain and leg swelling.  Gastrointestinal: Negative for vomiting, abdominal pain and diarrhea.  Genitourinary: Negative for difficulty urinating.  Musculoskeletal: Negative for arthralgias.  Skin: Negative for rash.  Neurological: Negative for tremors, syncope and headaches.  Hematological: Does not bruise/bleed easily.       Objective:   Physical Exam  amb wf nad Wt Readings from Last 3 Encounters:  09/16/13 158 lb 12.8 oz (72.031 kg)  07/03/13 157 lb (71.215 kg)  07/02/13 157 lb (71.215 kg)     HEENT: nl dentition,  and orophanx. Nl external ear canals without cough reflex - moderate non-specific turbinate edema   NECK :  without JVD/Nodes/TM/ nl carotid upstrokes bilaterally   LUNGS: no acc muscle use, clear to A and P bilaterally without cough on insp or exp maneuvers   CV:  RRR  no s3 or murmur or increase in P2, no edema   ABD:  soft and nontender with nl excursion in the supine position. No bruits or organomegaly, bowel sounds nl  MS:  warm without deformities, calf tenderness, cyanosis or clubbing  SKIN: warm and dry without lesions    NEURO:  alert, approp, no deficits      CT 07/03/13  No significant paranasal sinus disease.     Assessment & Plan:

## 2013-09-17 NOTE — Assessment & Plan Note (Signed)
-   [facility]  Almost certainly this is asthma ? Allergic (though s classic worsening seasonally or subjective/obj wheeze) vs gerd related  rec trial of dulera100 2 bid and noct pepcid plus gerd diet and regroup in 6 weeks

## 2013-09-22 ENCOUNTER — Encounter: Payer: Self-pay | Admitting: Certified Nurse Midwife

## 2013-09-22 ENCOUNTER — Ambulatory Visit (INDEPENDENT_AMBULATORY_CARE_PROVIDER_SITE_OTHER): Payer: 59 | Admitting: Certified Nurse Midwife

## 2013-09-22 VITALS — BP 122/74 | HR 84 | Temp 98.7°F | Ht 65.25 in | Wt 157.6 lb

## 2013-09-22 DIAGNOSIS — Z124 Encounter for screening for malignant neoplasm of cervix: Secondary | ICD-10-CM

## 2013-09-22 DIAGNOSIS — Z0000 Encounter for general adult medical examination without abnormal findings: Secondary | ICD-10-CM

## 2013-09-22 DIAGNOSIS — Z01419 Encounter for gynecological examination (general) (routine) without abnormal findings: Secondary | ICD-10-CM

## 2013-09-22 LAB — POCT URINALYSIS DIPSTICK
BILIRUBIN UA: NEGATIVE
Glucose, UA: NEGATIVE
KETONES UA: NEGATIVE
Nitrite, UA: NEGATIVE
PH UA: 6
PROTEIN UA: NEGATIVE
RBC UA: NEGATIVE
Urobilinogen, UA: NEGATIVE

## 2013-09-22 NOTE — Patient Instructions (Addendum)
General topics  Next pap or exam is  due in 1 year Take a Women's multivitamin Take 1200 mg. of calcium daily - prefer dietary If any concerns in interim to call back  Breast Self-Awareness Practicing breast self-awareness may pick up problems early, prevent significant medical complications, and possibly save your life. By practicing breast self-awareness, you can become familiar with how your breasts look and feel and if your breasts are changing. This allows you to notice changes early. It can also offer you some reassurance that your breast health is good. One way to learn what is normal for your breasts and whether your breasts are changing is to do a breast self-exam. If you find a lump or something that was not present in the past, it is best to contact your caregiver right away. Other findings that should be evaluated by your caregiver include nipple discharge, especially if it is bloody; skin changes or reddening; areas where the skin seems to be pulled in (retracted); or new lumps and bumps. Breast pain is seldom associated with cancer (malignancy), but should also be evaluated by a caregiver. BREAST SELF-EXAM The best time to examine your breasts is 5 7 days after your menstrual period is over.  ExitCare Patient Information 2013 [employer].   Exercise to Stay Healthy Exercise helps you become and stay healthy. EXERCISE IDEAS AND TIPS Choose exercises that:  You enjoy.  Fit into your day. You do not need to exercise really hard to be healthy. You can do exercises at a slow or medium level and stay healthy. You can:  Stretch before and after working out.  Try yoga, Pilates, or tai chi.  Lift weights.  Walk fast, swim, jog, run, climb stairs, bicycle, dance, or rollerskate.  Take aerobic classes. Exercises that burn about 150 calories:  Running 1  miles in 15 minutes.  Playing volleyball for 45 to 60 minutes.  Washing and waxing a car for 45 to 60  minutes.  Playing touch football for 45 minutes.  Walking 1  miles in 35 minutes.  Pushing a stroller 1  miles in 30 minutes.  Playing basketball for 30 minutes.  Raking leaves for 30 minutes.  Bicycling 5 miles in 30 minutes.  Walking 2 miles in 30 minutes.  Dancing for 30 minutes.  Shoveling snow for 15 minutes.  Swimming laps for 20 minutes.  Walking up stairs for 15 minutes.  Bicycling 4 miles in 15 minutes.  Gardening for 30 to 45 minutes.  Jumping rope for 15 minutes.  Washing windows or floors for 45 to 60 minutes. Document Released: 06/03/2010 Document Revised: 07/24/2011 Document Reviewed: 06/03/2010 [employer] Patient Information 2013 [employer].   Other topics ( that may be useful information):    Sexually Transmitted Disease Sexually transmitted disease (STD) refers to any infection that is passed from person to person during sexual activity. This may happen by way of saliva, semen, blood, vaginal mucus, or urine. Common STDs include:  Gonorrhea.  Chlamydia.  Syphilis.  HIV/AIDS.  Genital herpes.  Hepatitis B and C.  Trichomonas.  Human papillomavirus (HPV).  Pubic lice. CAUSES  An STD may be spread by bacteria, virus, or parasite. A person can get an STD by:  Sexual intercourse with an infected person.  Sharing sex toys with an infected person.  Sharing needles with an infected person.  Having intimate contact with the genitals, mouth, or rectal areas of an infected person. SYMPTOMS  Some people may not have any symptoms, but   they can still pass the infection to others. Different STDs have different symptoms. Symptoms include:  Painful or bloody urination.  Pain in the pelvis, abdomen, vagina, anus, throat, or eyes.  Skin rash, itching, irritation, growths, or sores (lesions). These usually occur in the genital or anal area.  Abnormal vaginal discharge.  Penile discharge in men.  Soft, flesh-colored skin growths in the  genital or anal area.  Fever.  Pain or bleeding during sexual intercourse.  Swollen glands in the groin area.  Yellow skin and eyes (jaundice). This is seen with hepatitis. DIAGNOSIS  To make a diagnosis, your caregiver may:  Take a medical history.  Perform a physical exam.  Take a specimen (culture) to be examined.  Examine a sample of discharge under a microscope.  Perform blood test TREATMENT   Chlamydia, gonorrhea, trichomonas, and syphilis can be cured with antibiotic medicine.  Genital herpes, hepatitis, and HIV can be treated, but not cured, with prescribed medicines. The medicines will lessen the symptoms.  Genital warts from HPV can be treated with medicine or by freezing, burning (electrocautery), or surgery. Warts may come back.  HPV is a virus and cannot be cured with medicine or surgery.However, abnormal areas may be followed very closely by your caregiver and may be removed from the cervix, vagina, or vulva through office procedures or surgery. If your diagnosis is confirmed, your recent sexual partners need treatment. This is true even if they are symptom-free or have a negative culture or evaluation. They should not have sex until their caregiver says it is okay. HOME CARE INSTRUCTIONS  All sexual partners should be informed, tested, and treated for all STDs.  Take your antibiotics as directed. Finish them even if you start to feel better.  Only take over-the-counter or prescription medicines for pain, discomfort, or fever as directed by your caregiver.  Rest.  Eat a balanced diet and drink enough fluids to keep your urine clear or pale yellow.  Do not have sex until treatment is completed and you have followed up with your caregiver. STDs should be checked after treatment.  Keep all follow-up appointments, Pap tests, and blood tests as directed by your caregiver.  Only use latex condoms and water-soluble lubricants during sexual activity. Do not use  petroleum jelly or oils.  Avoid alcohol and illegal drugs.  Get vaccinated for HPV and hepatitis. If you have not received these vaccines in the past, talk to your caregiver about whether one or both might be right for you.  Avoid risky sex practices that can break the skin. The only way to avoid getting an STD is to avoid all sexual activity.Latex condoms and dental dams (for oral sex) will help lessen the risk of getting an STD, but will not completely eliminate the risk. SEEK MEDICAL CARE IF:   You have a fever.  You have any new or worsening symptoms. Document Released: 07/22/2002 Document Revised: 07/24/2011 Document Reviewed: 07/29/2010 [facility] Patient Information 2013 [place].    Domestic Abuse You are being battered or abused if someone close to you hits, pushes, or physically hurts you in any way. You also are being abused if you are forced into activities. You are being sexually abused if you are forced to have sexual contact of any kind. You are being emotionally abused if you are made to feel worthless or if you are constantly threatened. It is important to remember that help is available. No one has the right to abuse you. PREVENTION OF FURTHER  ABUSE  Learn the warning signs of danger. This varies with situations but may include: the use of alcohol, threats, isolation from friends and family, or forced sexual contact. Leave if you feel that violence is going to occur.  If you are attacked or beaten, report it to the police so the abuse is documented. You do not have to press charges. The police can protect you while you or the attackers are leaving. Get the officer's name and badge number and a copy of the report.  Find someone you can trust and tell them what is happening to you: your caregiver, a nurse, clergy member, close friend or family member. Feeling ashamed is natural, but remember that you have done nothing wrong. No one deserves abuse. Document Released:  04/28/2000 Document Revised: 07/24/2011 Document Reviewed: 07/07/2010 [facility] Patient Information 2013 [place].    How Much is Too Much Alcohol? Drinking too much alcohol can cause injury, accidents, and health problems. These types of problems can include:   Car crashes.  Falls.  Family fighting (domestic violence).  Drowning.  Fights.  Injuries.  Burns.  Damage to certain organs.  Having a baby with birth defects. ONE DRINK CAN BE TOO MUCH WHEN YOU ARE:  Working.  Pregnant or breastfeeding.  Taking medicines. Ask your doctor.  Driving or planning to drive. If you or someone you know has a drinking problem, get help from a doctor.  Document Released: 02/25/2009 Document Revised: 07/24/2011 Document Reviewed: 02/25/2009 [facility] Patient Information 2013 [place].   Smoking Hazards Smoking cigarettes is extremely bad for your health. Tobacco smoke has over 200 known poisons in it. There are over 60 chemicals in tobacco smoke that cause cancer. Some of the chemicals found in cigarette smoke include:   Cyanide.  Benzene.  Formaldehyde.  Methanol (wood alcohol).  Acetylene (fuel used in welding torches).  Ammonia. Cigarette smoke also contains the poisonous gases nitrogen oxide and carbon monoxide.  Cigarette smokers have an increased risk of many serious medical problems and Smoking causes approximately:  90% of all lung cancer deaths in men.  80% of all lung cancer deaths in women.  90% of deaths from chronic obstructive lung disease. Compared with nonsmokers, smoking increases the risk of:  Coronary heart disease by 2 to 4 times.  Stroke by 2 to 4 times.  Men developing lung cancer by 23 times.  Women developing lung cancer by 13 times.  Dying from chronic obstructive lung diseases by 12 times.  . Smoking is the most preventable cause of death and disease in our society.  WHY IS SMOKING ADDICTIVE?  Nicotine is the chemical  agent in tobacco that is capable of causing addiction or dependence.  When you smoke and inhale, nicotine is absorbed rapidly into the bloodstream through your lungs. Nicotine absorbed through the lungs is capable of creating a powerful addiction. Both inhaled and non-inhaled nicotine may be addictive.  Addiction studies of cigarettes and spit tobacco show that addiction to nicotine occurs mainly during the teen years, when young people begin using tobacco products. WHAT ARE THE BENEFITS OF QUITTING?  There are many health benefits to quitting smoking.   Likelihood of developing cancer and heart disease decreases. Health improvements are seen almost immediately.  Blood pressure, pulse rate, and breathing patterns start returning to normal soon after quitting. QUITTING SMOKING   American Lung Association - 1-800-LUNGUSA  American Cancer Society - 1-800-ACS-2345 Document Released: 06/08/2004 Document Revised: 07/24/2011 Document Reviewed: 02/10/2009 [facility] Patient Information 2013 [place].   Stress Management Stress is a state of physical or mental tension that often results from changes in your life or normal routine. Some common causes of stress are:  Death of a loved one.  Injuries or severe illnesses.  Getting fired or changing jobs.  Moving into a new home. Other causes may be:  Sexual problems.  Business or financial losses.  Taking on a large debt.  Regular conflict with someone at home or at work.  Constant tiredness from lack of sleep. It is not just bad things that are stressful. It may be stressful to:  Win the lottery.  Get married.  Buy a new car. The amount of stress that can be easily tolerated varies from person to person. Changes generally cause stress, regardless of the types of change. Too much stress can affect your health. It may lead to physical or emotional problems. Too little stress (boredom) may also become stressful. SUGGESTIONS TO  REDUCE STRESS:  Talk things over with your family and friends. It often is helpful to share your concerns and worries. If you feel your problem is serious, you may want to get help from a professional counselor.  Consider your problems one at a time instead of lumping them all together. Trying to take care of everything at once may seem impossible. List all the things you need to do and then start with the most important one. Set a goal to accomplish 2 or 3 things each day. If you expect to do too many in a single day you will naturally fail, causing you to feel even more stressed.  Do not use alcohol or drugs to relieve stress. Although you may feel better for a short time, they do not remove the problems that caused the stress. They can also be habit forming.  Exercise regularly - at least 3 times per week. Physical exercise can help to relieve that "uptight" feeling and will relax you.  The shortest distance between despair and hope is often a good night's sleep.  Go to bed and get up on time allowing yourself time for appointments without being rushed.  Take a short "time-out" period from any stressful situation that occurs during the day. Close your eyes and take some deep breaths. Starting with the muscles in your face, tense them, hold it for a few seconds, then relax. Repeat this with the muscles in your neck, shoulders, hand, stomach, back and legs.  Take good care of yourself. Eat a balanced diet and get plenty of rest.  Schedule time for having fun. Take a break from your daily routine to relax. HOME CARE INSTRUCTIONS   Call if you feel overwhelmed by your problems and feel you can no longer manage them on your own.  Return immediately if you feel like hurting yourself or someone else. Document Released: 10/25/2000 Document Revised: 07/24/2011 Document Reviewed: 06/17/2007 [facility] Patient Information 2013 [place].  It was great to meet you today. Questions or concerns  please call Have a great spring!  [facility]bi Pregnancy If you are planning on getting pregnant, it is a good idea to make a preconception appointment with your caregiver to discuss having a healthy lifestyle before getting pregnant. This includes diet, weight, exercise, taking prenatal vitamins (especially folic acid, which helps prevent brain and spinal cord defects), avoiding alcohol, smoking and illegal drugs, medical problems (diabetes, convulsions), family history of genetic problems, working conditions, and immunizations. It is better to have knowledge of these things and do  something about them before getting pregnant. During your pregnancy, it is important to follow certain guidelines in order to have a healthy baby. It is very important to get good prenatal care and follow your caregiver's instructions. Prenatal care includes all the medical care you receive before your baby's birth. This helps to prevent problems during the pregnancy and childbirth. HOME CARE INSTRUCTIONS   Start your prenatal visits by the 12th week of pregnancy or earlier, if possible. At first, appointments are usually scheduled monthly. They become more frequent in the last 2 months before delivery. It is important that you keep your caregiver's appointments and follow your caregiver's instructions regarding medication use, exercise, and diet.  During pregnancy, you are providing food for you and your baby. Eat a regular, well-balanced diet. Choose foods such as meat, fish, milk and other dairy products, vegetables, fruits, whole-grain breads and cereals. Your caregiver will inform you of the ideal weight gain depending on your current height and weight. Drink lots of liquids. Try to drink 8 glasses of water a day.  Alcohol is associated with a number of birth defects including fetal alcohol syndrome. It is best to avoid alcohol completely. Smoking will cause low birth rate and prematurity. Use of alcohol and nicotine during your  pregnancy also increases the chances that your child will be chemically dependent later in their life and may contribute to SIDS (Sudden Infant Death Syndrome).  Do not use illegal drugs.  Only take prescription or over-the-counter medications that are recommended by your caregiver. Other medications can cause genetic and physical problems in the baby.  Morning sickness can often be helped by keeping soda crackers at the bedside. Eat a few before getting up in the morning.  A sexual relationship may be continued until near the end of pregnancy if there are no other problems such as early (premature) leaking of amniotic fluid from the membranes, vaginal bleeding, painful intercourse or belly (abdominal) pain.  Exercise regularly. Check with your caregiver if you are unsure of the safety of some of your exercises.  Do not use hot tubs, steam rooms or saunas. These increase the risk of fainting and hurting yourself and the baby. Swimming is OK for exercise. Get plenty of rest, including afternoon naps when possible, especially in the third trimester.  Avoid toxic odors and chemicals.  Do not wear high heels. They may cause you to lose your balance and fall.  Do not lift over 5 pounds. If you do lift anything, lift with your legs and thighs, not your back.  Avoid long trips, especially in the third trimester.  If you have to travel out of the city or state, take a copy of your medical records with you. SEEK IMMEDIATE MEDICAL CARE IF:   You develop an unexplained oral temperature above 102 F (38.9 C), or as your caregiver suggests.  You have leaking of fluid from the vagina. If leaking membranes are suspected, take your temperature and inform your caregiver of this when you call.  There is vaginal spotting or bleeding. Notify your caregiver of the amount and how many pads are used.  You continue to feel sick to your stomach (nauseous) and have no relief from remedies suggested, or you  throw up (vomit) blood or coffee ground like materials.  You develop upper abdominal pain.  You have round ligament discomfort in the lower abdominal area. This still must be evaluated by your caregiver.  You feel contractions of the uterus.  You do not  feel the baby move, or there is less movement than before.  You have painful urination.  You have abnormal vaginal discharge.  You have persistent diarrhea.  You get a severe headache.  You have problems with your vision.  You develop muscle weakness.  You feel dizzy and faint.  You develop shortness of breath.  You develop chest pain.  You have back pain that travels down to your leg and feet.  You feel irregular or a very fast heartbeat.  You develop excessive weight gain in a short period of time (5 pounds in 3 to 5 days).  You are involved in a domestic violence situation. Document Released: 05/01/2005 Document Revised: 10/31/2011 Document Reviewed: 10/23/2008 [facility] Patient Information 2014 [place].      General topics  Next pap or exam is  due in 1 year Take a Women's multivitamin Take 1200 mg. of calcium daily - prefer dietary If any concerns in interim to call back  Breast Self-Awareness Practicing breast self-awareness may pick up problems early, prevent significant medical complications, and possibly save your life. By practicing breast self-awareness, you can become familiar with how your breasts look and feel and if your breasts are changing. This allows you to notice changes early. It can also offer you some reassurance that your breast health is good. One way to learn what is normal for your breasts and whether your breasts are changing is to do a breast self-exam. If you find a lump or something that was not present in the past, it is best to contact your caregiver right away. Other findings that should be evaluated by your caregiver include nipple discharge, especially if it is bloody; skin  changes or reddening; areas where the skin seems to be pulled in (retracted); or new lumps and bumps. Breast pain is seldom associated with cancer (malignancy), but should also be evaluated by a caregiver. BREAST SELF-EXAM The best time to examine your breasts is 5 7 days after your menstrual period is over.  [employer] Patient Information 2013 [place].   Exercise to Stay Healthy Exercise helps you become and stay healthy. EXERCISE IDEAS AND TIPS Choose exercises that:  You enjoy.  Fit into your day. You do not need to exercise really hard to be healthy. You can do exercises at a slow or medium level and stay healthy. You can:  Stretch before and after working out.  Try yoga, Pilates, or tai chi.  Lift weights.  Walk fast, swim, jog, run, climb stairs, bicycle, dance, or rollerskate.  Take aerobic classes. Exercises that burn about 150 calories:  Running 1  miles in 15 minutes.  Playing volleyball for 45 to 60 minutes.  Washing and waxing a car for 45 to 60 minutes.  Playing touch football for 45 minutes.  Walking 1  miles in 35 minutes.  Pushing a stroller 1  miles in 30 minutes.  Playing basketball for 30 minutes.  Raking leaves for 30 minutes.  Bicycling 5 miles in 30 minutes.  Walking 2 miles in 30 minutes.  Dancing for 30 minutes.  Shoveling snow for 15 minutes.  Swimming laps for 20 minutes.  Walking up stairs for 15 minutes.  Bicycling 4 miles in 15 minutes.  Gardening for 30 to 45 minutes.  Jumping rope for 15 minutes.  Washing windows or floors for 45 to 60 minutes. Document Released: 06/03/2010 Document Revised: 07/24/2011 Document Reviewed: 06/03/2010 [facility] Patient Information 2013 [place].   Other topics ( that may be useful  information):    Sexually Transmitted Disease Sexually transmitted disease (STD) refers to any infection that is passed from person to person during sexual activity. This may happen by way of  saliva, semen, blood, vaginal mucus, or urine. Common STDs include:  Gonorrhea.  Chlamydia.  Syphilis.  HIV/AIDS.  Genital herpes.  Hepatitis B and C.  Trichomonas.  Human papillomavirus (HPV).  Pubic lice. CAUSES  An STD may be spread by bacteria, virus, or parasite. A person can get an STD by:  Sexual intercourse with an infected person.  Sharing sex toys with an infected person.  Sharing needles with an infected person.  Having intimate contact with the genitals, mouth, or rectal areas of an infected person. SYMPTOMS  Some people may not have any symptoms, but they can still pass the infection to others. Different STDs have different symptoms. Symptoms include:  Painful or bloody urination.  Pain in the pelvis, abdomen, vagina, anus, throat, or eyes.  Skin rash, itching, irritation, growths, or sores (lesions). These usually occur in the genital or anal area.  Abnormal vaginal discharge.  Penile discharge in men.  Soft, flesh-colored skin growths in the genital or anal area.  Fever.  Pain or bleeding during sexual intercourse.  Swollen glands in the groin area.  Yellow skin and eyes (jaundice). This is seen with hepatitis. DIAGNOSIS  To make a diagnosis, your caregiver may:  Take a medical history.  Perform a physical exam.  Take a specimen (culture) to be examined.  Examine a sample of discharge under a microscope.  Perform blood test TREATMENT   Chlamydia, gonorrhea, trichomonas, and syphilis can be cured with antibiotic medicine.  Genital herpes, hepatitis, and HIV can be treated, but not cured, with prescribed medicines. The medicines will lessen the symptoms.  Genital warts from HPV can be treated with medicine or by freezing, burning (electrocautery), or surgery. Warts may come back.  HPV is a virus and cannot be cured with medicine or surgery.However, abnormal areas may be followed very closely by your caregiver and may be removed from  the cervix, vagina, or vulva through office procedures or surgery. If your diagnosis is confirmed, your recent sexual partners need treatment. This is true even if they are symptom-free or have a negative culture or evaluation. They should not have sex until their caregiver says it is okay. HOME CARE INSTRUCTIONS  All sexual partners should be informed, tested, and treated for all STDs.  Take your antibiotics as directed. Finish them even if you start to feel better.  Only take over-the-counter or prescription medicines for pain, discomfort, or fever as directed by your caregiver.  Rest.  Eat a balanced diet and drink enough fluids to keep your urine clear or pale yellow.  Do not have sex until treatment is completed and you have followed up with your caregiver. STDs should be checked after treatment.  Keep all follow-up appointments, Pap tests, and blood tests as directed by your caregiver.  Only use latex condoms and water-soluble lubricants during sexual activity. Do not use petroleum jelly or oils.  Avoid alcohol and illegal drugs.  Get vaccinated for HPV and hepatitis. If you have not received these vaccines in the past, talk to your caregiver about whether one or both might be right for you.  Avoid risky sex practices that can break the skin. The only way to avoid getting an STD is to avoid all sexual activity.Latex condoms and dental dams (for oral sex) will help lessen the risk of getting  an STD, but will not completely eliminate the risk. SEEK MEDICAL CARE IF:   You have a fever.  You have any new or worsening symptoms. Document Released: 07/22/2002 Document Revised: 07/24/2011 Document Reviewed: 07/29/2010 [facility] Patient Information 2013 [place].    Domestic Abuse You are being battered or abused if someone close to you hits, pushes, or physically hurts you in any way. You also are being abused if you are forced into activities. You are being sexually abused  if you are forced to have sexual contact of any kind. You are being emotionally abused if you are made to feel worthless or if you are constantly threatened. It is important to remember that help is available. No one has the right to abuse you. PREVENTION OF FURTHER ABUSE  Learn the warning signs of danger. This varies with situations but may include: the use of alcohol, threats, isolation from friends and family, or forced sexual contact. Leave if you feel that violence is going to occur.  If you are attacked or beaten, report it to the police so the abuse is documented. You do not have to press charges. The police can protect you while you or the attackers are leaving. Get the officer's name and badge number and a copy of the report.  Find someone you can trust and tell them what is happening to you: your caregiver, a nurse, clergy member, close friend or family member. Feeling ashamed is natural, but remember that you have done nothing wrong. No one deserves abuse. Document Released: 04/28/2000 Document Revised: 07/24/2011 Document Reviewed: 07/07/2010 [facility] Patient Information 2013 [place].    How Much is Too Much Alcohol? Drinking too much alcohol can cause injury, accidents, and health problems. These types of problems can include:   Car crashes.  Falls.  Family fighting (domestic violence).  Drowning.  Fights.  Injuries.  Burns.  Damage to certain organs.  Having a baby with birth defects. ONE DRINK CAN BE TOO MUCH WHEN YOU ARE:  Working.  Pregnant or breastfeeding.  Taking medicines. Ask your doctor.  Driving or planning to drive. If you or someone you know has a drinking problem, get help from a doctor.  Document Released: 02/25/2009 Document Revised: 07/24/2011 Document Reviewed: 02/25/2009 [facility] Patient Information 2013 [place].   Smoking Hazards Smoking cigarettes is extremely bad for your health. Tobacco smoke has over 200 known  poisons in it. There are over 60 chemicals in tobacco smoke that cause cancer. Some of the chemicals found in cigarette smoke include:   Cyanide.  Benzene.  Formaldehyde.  Methanol (wood alcohol).  Acetylene (fuel used in welding torches).  Ammonia. Cigarette smoke also contains the poisonous gases nitrogen oxide and carbon monoxide.  Cigarette smokers have an increased risk of many serious medical problems and Smoking causes approximately:  90% of all lung cancer deaths in men.  80% of all lung cancer deaths in women.  90% of deaths from chronic obstructive lung disease. Compared with nonsmokers, smoking increases the risk of:  Coronary heart disease by 2 to 4 times.  Stroke by 2 to 4 times.  Men developing lung cancer by 23 times.  Women developing lung cancer by 13 times.  Dying from chronic obstructive lung diseases by 12 times.  . Smoking is the most preventable cause of death and disease in our society.  WHY IS SMOKING ADDICTIVE?  Nicotine is the chemical agent in tobacco that is capable of causing addiction or dependence.  When you smoke and  inhale, nicotine is absorbed rapidly into the bloodstream through your lungs. Nicotine absorbed through the lungs is capable of creating a powerful addiction. Both inhaled and non-inhaled nicotine may be addictive.  Addiction studies of cigarettes and spit tobacco show that addiction to nicotine occurs mainly during the teen years, when young people begin using tobacco products. WHAT ARE THE BENEFITS OF QUITTING?  There are many health benefits to quitting smoking.   Likelihood of developing cancer and heart disease decreases. Health improvements are seen almost immediately.  Blood pressure, pulse rate, and breathing patterns start returning to normal soon after quitting. QUITTING SMOKING   American Lung Association - 1-800-LUNGUSA  American Cancer Society - 1-800-ACS-2345 Document Released: 06/08/2004 Document Revised:  07/24/2011 Document Reviewed: 02/10/2009 [facility] Patient Information 2013 [place].   Stress Management Stress is a state of physical or mental tension that often results from changes in your life or normal routine. Some common causes of stress are:  Death of a loved one.  Injuries or severe illnesses.  Getting fired or changing jobs.  Moving into a new home. Other causes may be:  Sexual problems.  Business or financial losses.  Taking on a large debt.  Regular conflict with someone at home or at work.  Constant tiredness from lack of sleep. It is not just bad things that are stressful. It may be stressful to:  Win the lottery.  Get married.  Buy a new car. The amount of stress that can be easily tolerated varies from person to person. Changes generally cause stress, regardless of the types of change. Too much stress can affect your health. It may lead to physical or emotional problems. Too little stress (boredom) may also become stressful. SUGGESTIONS TO REDUCE STRESS:  Talk things over with your family and friends. It often is helpful to share your concerns and worries. If you feel your problem is serious, you may want to get help from a professional counselor.  Consider your problems one at a time instead of lumping them all together. Trying to take care of everything at once may seem impossible. List all the things you need to do and then start with the most important one. Set a goal to accomplish 2 or 3 things each day. If you expect to do too many in a single day you will naturally fail, causing you to feel even more stressed.  Do not use alcohol or drugs to relieve stress. Although you may feel better for a short time, they do not remove the problems that caused the stress. They can also be habit forming.  Exercise regularly - at least 3 times per week. Physical exercise can help to relieve that "uptight" feeling and will relax you.  The shortest distance  between despair and hope is often a good night's sleep.  Go to bed and get up on time allowing yourself time for appointments without being rushed.  Take a short "time-out" period from any stressful situation that occurs during the day. Close your eyes and take some deep breaths. Starting with the muscles in your face, tense them, hold it for a few seconds, then relax. Repeat this with the muscles in your neck, shoulders, hand, stomach, back and legs.  Take good care of yourself. Eat a balanced diet and get plenty of rest.  Schedule time for having fun. Take a break from your daily routine to relax. HOME CARE INSTRUCTIONS   Call if you feel overwhelmed by your problems and feel you can  no longer manage them on your own.  Return immediately if you feel like hurting yourself or someone else. Document Released: 10/25/2000 Document Revised: 07/24/2011 Document Reviewed: 06/17/2007 [facility] Patient Information 2013 [place].

## 2013-09-22 NOTE — Progress Notes (Signed)
28 y.o. G0P0000 Married Caucasian Fe here to establish care and  for annual exam. Periods normal, no issues. Patient previous use of OCP. Was screened for blood clot in leg but was negative for. Prefers no hormonal products. May plan to try for pregnancy in next year.Contraception condoms. Denies any UTI symptoms or vaginal discharge symptoms today. Period due. History of one migraine a month with aura. 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 No other health issues today.  Patient's last menstrual period was 08/27/2013.          Sexually active: yes  The current method of family planning is condoms all the time.    Exercising: no  exercise Smoker:  no  Health Maintenance: Pap:  1/14 normal Never had abnormal pap smear MMG:  none Colonoscopy:  none BMD:   none TDaP:  Unsure up to date Labs: Poct urine-ph-6, wbc 1+ Self breast exam: not done   reports that she has never smoked. She has never used smokeless tobacco. She reports that she drinks alcohol. She reports that she does not use illicit drugs.  Past Medical History  Diagnosis Date  . Asthma   . Blood transfusion without reported diagnosis   . Anxiety   . Depression   . Asthma, mild persistent 05/12/2013  . Anxiety and depression 05/12/2013  . Migraines     aura, once monthly  . Anemia     in the past  . Hemolytic uremic syndrome      blood transfusion age 39 resolved problems    Past Surgical History  Procedure Laterality Date  . Wisdom tooth extraction  2008  . Lumbar puncture      pt had a blood transfusion in 1991    Current Outpatient Prescriptions  Medication Sig Dispense Refill  . mometasone-formoterol (DULERA) 100-5 MCG/ACT AERO Take 2 puffs first thing in am and then another 2 puffs about 12 hours later.    0  . famotidine (PEPCID) 20 MG tablet One at bedtime  30 tablet  11   No current facility-administered medications for this visit.    Family History  Problem Relation Age of Onset  . Skin  cancer Mother   . Hyperlipidemia Mother   . Allergies Mother   . Asthma Mother   . Rheum arthritis Maternal Aunt   . Stroke Maternal Grandmother     ROS:  Pertinent items are noted in HPI.  Otherwise, a comprehensive ROS was negative.  Exam:   BP 122/74  Pulse 84  Temp(Src) 98.7 F (37.1 C)  Ht 5' 5.25" (1.657 m)  Wt 157 lb 9.6 oz (71.487 kg)  BMI 26.04 kg/m2  LMP 08/27/2013 Height: 5' 5.25" (165.7 cm)  Ht Readings from Last 3 Encounters:  09/22/13 5' 5.25" (1.657 m)  09/16/13 5\' 6"  (1.676 m)  07/03/13 5\' 6"  (1.676 m)    General appearance: alert, cooperative and appears stated age Head: Normocephalic, without obvious abnormality, atraumatic Neck: no adenopathy, supple, symmetrical, trachea midline and thyroid normal to inspection and palpation and non-palpable Lungs: clear to auscultation bilaterally Breasts: normal appearance, no masses or tenderness, No nipple retraction or dimpling, No nipple discharge or bleeding, No axillary or supraclavicular adenopathy Heart: regular rate and rhythm Abdomen: soft, non-tender; no masses,  no organomegaly, negative supra pubic Extremities: extremities normal, atraumatic, no cyanosis or edema Skin: Skin color, texture, turgor normal. No rashes or lesions Lymph nodes: Cervical, supraclavicular, and axillary nodes normal. No abnormal inguinal nodes palpated Neurologic: Grossly normal  Pelvic: External genitalia:  no lesions              Urethra:  normal appearing urethra with no masses, tenderness or lesions, bladder non tender              Bartholin's and Skene's: normal                 Vagina: normal appearing vagina with normal color and normal white discharge, no lesions              Cervix: normal, non tender              Pap taken: yes Bimanual Exam:  Uterus:  normal size, contour, position, consistency, mobility, non-tender and anteflexed              Adnexa: normal adnexa and no mass, fullness, tenderness                Rectovaginal: Confirms               Anus: deferred  A:  Well Woman with normal exam  Contraception condoms  History of Migraine with aura  History of Anxiety/depression no medications now  Left breast tenderness past week, premenstrual  P:   Reviewed health and wellness pertinent to exam  Pap smear taken today with HPV reflex  Aware of warning signs and symptoms  Discussed B complex for two weeks prior to period to help with fluid retention and increase water intake during that time  Discussed normal breast tenderness with period, should resolve after period ceases. Also discussed change in size of breast with weight changes. Questions addressed. Patient encouraged to decrease underwire bra use, due to marks being noted around breast with use, which can contribute to discomfort. Patient to advise if tenderness does not resolve for follow up here.  counseled on breast self exam, adequate intake of calcium and vitamin D, diet and exercise return annually or prn  An After Visit Summary was printed and given to the patient.

## 2013-09-23 LAB — IPS PAP TEST WITH REFLEX TO HPV

## 2013-09-24 ENCOUNTER — Other Ambulatory Visit: Payer: Self-pay | Admitting: Certified Nurse Midwife

## 2013-09-24 ENCOUNTER — Telehealth: Payer: Self-pay | Admitting: Emergency Medicine

## 2013-09-24 DIAGNOSIS — R87612 Low grade squamous intraepithelial lesion on cytologic smear of cervix (LGSIL): Secondary | ICD-10-CM

## 2013-09-25 NOTE — Telephone Encounter (Signed)
Message copied by Joeseph AmorFAST, Kimbely Whiteaker L on Thu Sep 25, 2013 11:15 AM ------      Message from: Verner CholLEONARD, DEBORAH S      Created: Wed Sep 24, 2013  7:56 AM       Notify patient pap abnormal with LSIL. Patient needs colposcopy.      Order in ------

## 2013-09-25 NOTE — Telephone Encounter (Signed)
Message left to return call to [facility] at [phone].   Not currently on contraception, condoms only.  LSIL, no hx of abnormal.  Patient to be notified OK to schedule with DL

## 2013-09-25 NOTE — Progress Notes (Signed)
Reviewed personally.  M. Suzanne Andrez Lieurance, MD.  

## 2013-09-25 NOTE — Telephone Encounter (Signed)
Patient returned call.  She is expecting cycle soon, LMP 08/27/13. Currently Condoms only for birth control. Advised needs to call with first day of menses so that she can be scheduled for colposcopy prior to 12th day of menstrual cycle.  Patient is agreeable and verbalized understanding of instructions. Advised very important to keep consistent with condom use.  She will call with first day of menses to schedule colposcopy and will instructions prior. Patient given out of pocket cost.

## 2013-09-30 ENCOUNTER — Ambulatory Visit: Payer: 59 | Admitting: Internal Medicine

## 2013-09-30 NOTE — Telephone Encounter (Signed)
Patient returned call. She states that her LMP was 08/27/13 and she is concerned she has not started her cycle yet. She feels she is having increased stressed from work and now with stress from needing colposcopy. She is concerned that she has not started her cycle yet. I advised that she can take home pregnancy test (can wait until tomorrow morning to test) and to call back with results, +/-.  Will need to wait until cycle starts to schedule colposcopy to ensure cycle time and dates are correct. Advised patient cycle may be irregular but that first step is home pregnancy test. Patient is agreeable. She will call back with results. Advised I would send a message to Verner Choleborah S. Leonard CNM  as well for any other instructions.

## 2013-09-30 NOTE — Telephone Encounter (Signed)
Agree with note. 

## 2013-10-01 NOTE — Telephone Encounter (Signed)
Pt says she is calling about the biopsy. She has not started her cycle and she is not pregnant.

## 2013-10-02 NOTE — Telephone Encounter (Signed)
[facility].

## 2013-10-02 NOTE — Telephone Encounter (Signed)
Patient returned call, states she is 6 days late and using condoms for contraception. Neg home UPT. Anxious about pa results and getting procedure scheduled. Works for [employer] so she is worried about phrase of "abnormal cells". Brief review of LGSIL, colpo procedure and possible treatment if needed is usually LEEP which is just an in-office procedure to remove abnormal cells Patient interested in pregnancy in the next year, explained that she is not likely looking at cervical cancer and hysterectomy, although cant promise this. Discussed that majority of patients will resolve this on own, just need to monitor for improvement.  Patient states she feels much better.  Advised need to wait for menses.  If no cycle for 6 weeks (2 weeks late) will need to call for OV with Debbi to determine if cycle needs to be induced. Call if no cycle by 10-13-13. If starts menses, call to schedule colpo.  Routing to provider for final review. Patient agreeable to disposition. Will close encounter

## 2013-10-02 NOTE — Telephone Encounter (Signed)
agree

## 2013-10-07 ENCOUNTER — Telehealth: Payer: Self-pay | Admitting: Certified Nurse Midwife

## 2013-10-07 NOTE — Telephone Encounter (Signed)
Spoke with patient. Appointment scheduled for 11:30 AM on Monday June 1st with Dr.Silva. Patient agreeable to date and time. Colposcopy pre-procedure instructions given. Motrin instructions given. Motrin=Advil=Ibuprofen Can take 800 mg (Can purchase over the counter, you will need four 200 mg pills) every 8 hours as needed.  Take with food. Make sure to eat a meal before appointment and drink plenty of fluids. Patient verbalized understanding and will call to reschedule if will be on menses or has any concerns regarding pregnancy. Advised will need to cancel within 24 hours or will have $100.00 no show fee placed to account. Patient agreeable and verbalizes understanding.  CC: Dr.Silva  Routing to provider for final review. Patient agreeable to disposition. Will close encounter

## 2013-10-07 NOTE — Telephone Encounter (Signed)
Spoke with patient. Patient states that she started cycle yesterday and would like to schedule colposcopy at this time. Patient would like to come in early next week. Advised that Verner Chol CNM will be out of the office on next Monday but another provider may be able to get her in on that day. Patient agreeable. Advised would have to check the schedule and give patient a call back with the best time and date to schedule. Patient agreeable.

## 2013-10-07 NOTE — Telephone Encounter (Signed)
Patient said she needs to schedule an appointment for biopsy?

## 2013-10-13 ENCOUNTER — Encounter: Payer: Self-pay | Admitting: Obstetrics and Gynecology

## 2013-10-13 ENCOUNTER — Ambulatory Visit (INDEPENDENT_AMBULATORY_CARE_PROVIDER_SITE_OTHER): Payer: 59 | Admitting: Obstetrics and Gynecology

## 2013-10-13 ENCOUNTER — Ambulatory Visit: Payer: 59 | Admitting: Internal Medicine

## 2013-10-13 VITALS — BP 102/62 | HR 80 | Resp 16 | Ht 65.25 in | Wt 154.6 lb

## 2013-10-13 DIAGNOSIS — R87612 Low grade squamous intraepithelial lesion on cytologic smear of cervix (LGSIL): Secondary | ICD-10-CM

## 2013-10-13 NOTE — Progress Notes (Signed)
Subjective:     Patient ID: Kim Rodriguez, female   DOB: Dec 17, 1985, 28 y.o.   MRN: [id number]  HPI  28 year old married G0P0 with LMP 10/05/13. Here for colposcopy for pap 09/22/13 showing LGSIL. Condoms for contraception.   First abnormal pap. Status post Gardasil vaccine times 3.   Considering childbearing in 6 months.   Review of Systems     Objective:   Physical Exam  Genitourinary:        Colposcopy of the cervix and the vulva. Consent for procedure.  Speculum placed in the vagina.  Acetic acid placed.  Colposcopy satisfactory.  ECC performed and sent to pathology.  Thin rim of acetowhite across exocervix at both 12 and 6 o'clock.   Biopsy of each 12 and 6 o'clock areas sent to pathology together.  Monsel's applied. Minimal EBL.  No complications.     Assessment:     LGSIL pap.     Plan:     Discussion regarding HPV and abnormal paps and LEEP procedure.  Instructions given to patient regarding post colpo care.  Contact patient with biopsy results.  Anticipate pap in one year.     15 minutes face to face time of which over 50% was spent in counseling.   After visit summary to patient.

## 2013-10-13 NOTE — Patient Instructions (Signed)
Colposcopy Care After Colposcopy is a procedure in which a special tool is used to magnify the surface of the cervix. A tissue sample (biopsy) may also be taken. This sample will be looked at for cervical cancer or other problems. After the test:  You may have some cramping.  Lie down for a few minutes if you feel lightheaded.   You may have some bleeding which should stop in a few days. HOME CARE  Do not have sex or use tampons for 2 to 3 days or as told.  Only take medicine as told by your doctor.  Continue to take your birth control pills as usual. Finding out the results of your test Ask when your test results will be ready. Make sure you get your test results. GET HELP RIGHT AWAY IF:  You are bleeding a lot or are passing blood clots.  You develop a fever of 102 F (38.9 C) or higher.  You have abnormal vaginal discharge.  You have cramps that do not go away with medicine.  You feel lightheaded, dizzy, or pass out (faint). MAKE SURE YOU:   Understand these instructions.  Will watch your condition.  Will get help right away if you are not doing well or get worse. Document Released: 10/18/2007 Document Revised: 07/24/2011 Document Reviewed: 11/28/2012 [employer] Patient Information 2014 [employer].  

## 2013-10-15 LAB — IPS OTHER TISSUE BIOPSY

## 2013-10-24 ENCOUNTER — Emergency Department
Admission: EM | Admit: 2013-10-24 | Discharge: 2013-10-24 | Disposition: A | Discharge status: Home / Self Care | Payer: 59 | Source: Home / Self Care | Attending: Family Medicine | Admitting: Family Medicine

## 2013-10-24 ENCOUNTER — Encounter: Payer: Self-pay | Admitting: Emergency Medicine

## 2013-10-24 DIAGNOSIS — H02849 Edema of unspecified eye, unspecified eyelid: Secondary | ICD-10-CM

## 2013-10-24 MED ORDER — DOXYCYCLINE HYCLATE 100 MG PO CAPS: 100.0000 mg | ORAL_CAPSULE | Freq: Two times a day (BID) | ORAL | Status: DC

## 2013-10-24 NOTE — ED Provider Notes (Signed)
CSN: [id number]     Arrival date & time    History   None    Chief Complaint  Patient presents with  . Eye Pain        HPI Comments: Patient noticed mild irritation/tenderness at the outer edge of her left eye (not in the eye itself) three days ago.  The area has gradually become more swollen, and today she has had progressive swelling in her left upper eyelid.  She has noted a vague tingly sensation in the area below her left eye.  She feels well otherwise.  No vision changes.  No fevers, chills, and sweats.  No recent URI symptoms or sinusitis.  No facial rash. She had a somewhat similar episode of swelling around her right eye four months ago, involving actual pain and foreign body sensation in her right eye, and diagnosed as preseptal cellulitis.  She gradually improved on doxycycline.  Her present condition is not as painful as previously.  Patient is a 28 y.o. female presenting with eye problem. The history is provided by the patient.  Eye Problem Location:  L eye Quality:  Aching Severity:  Mild Onset quality:  Sudden Duration:  3 days Timing:  Constant Progression:  Worsening Chronicity:  New Relieved by:  Nothing Worsened by:  Nothing tried Ineffective treatments:  None tried Associated symptoms: swelling and tingling   Associated symptoms: no blurred vision, no crusting, no decreased vision, no discharge, no double vision, no facial rash, no foreign body sensation, no headaches, no inflammation, no itching, no nausea, no numbness, no photophobia, no redness, no tearing and no weakness   Risk factors: no recent herpes zoster and no recent URI     Past Medical History  Diagnosis Date  . Asthma   . Blood transfusion without reported diagnosis   . Anxiety   . Depression   . Asthma, mild persistent 05/12/2013  . Anxiety and depression 05/12/2013  . Migraines     aura, once monthly  . Anemia     in the past  . Hemolytic uremic syndrome      blood transfusion age 74  resolved problems   Past Surgical History  Procedure Laterality Date  . Wisdom tooth extraction  2008  . Lumbar puncture      pt had a blood transfusion in 1991   Family History  Problem Relation Age of Onset  . Skin cancer Mother   . Hyperlipidemia Mother   . Allergies Mother   . Asthma Mother   . Rheum arthritis Maternal Aunt   . Stroke Maternal Grandmother    History  Substance Use Topics  . Smoking status: Never Smoker   . Smokeless tobacco: Never Used  . Alcohol Use: Yes     Comment: 1-2 drink a month   OB History   Grav Para Term Preterm Abortions TAB SAB Ect Mult Living   0 0 0 0 0 0 0 0 0 0      Review of Systems  Eyes: Negative for blurred vision, double vision, photophobia, discharge, redness and itching.  Gastrointestinal: Negative for nausea.  Neurological: Positive for tingling. Negative for weakness, numbness and headaches.  All other systems reviewed and are negative.   Allergies  Influenza vaccines; Tamiflu; and Sulfa antibiotics  Home Medications   Prior to Admission medications   Medication Sig Start Date End Date Taking? Authorizing Provider  doxycycline (VIBRAMYCIN) 100 MG capsule Take 1 capsule (100 mg total) by mouth 2 (two) times daily. 10/24/13  Lattie HawStephen A Sammy Douthitt, MD  famotidine (PEPCID) 20 MG tablet One at bedtime 09/16/13   Nyoka CowdenMichael B Wert, MD  mometasone-formoterol [facility](DULERA) 100-5 MCG/ACT AERO Take 2 puffs first thing in am and then another 2 puffs about 12 hours later. 09/16/13   Nyoka CowdenMichael B Wert, MD   BP 104/72  Pulse 81  Temp(Src) 98.1 F (36.7 C) (Oral)  Resp 16  Ht 5\' 4"  (1.626 m)  Wt 155 lb (70.308 kg)  BMI 26.59 kg/m2  SpO2 98%  LMP 10/05/2013 Physical Exam  Nursing note and vitals reviewed. Constitutional: She appears well-developed and well-nourished. No distress.  HENT:  Head: Normocephalic and atraumatic. Head is without left periorbital erythema.    Right Ear: External ear normal.  Left Ear: External ear normal.  Nose: Nose  normal.  Mouth/Throat: Oropharynx is clear and moist.  Left upper eyelid has mild edema and tenderness, but no erythema or warmth.  Area below left eye is mildly tender without swelling, erythema, or warmth.  Eyes: Conjunctivae and EOM are normal. Pupils are equal, round, and reactive to light. Right eye exhibits no discharge. Left eye exhibits no discharge.  Neck: Neck supple.  Tender shotty left posterior nodes are present  Lymphadenopathy:    She has cervical adenopathy.    ED Course  Procedures  none      MDM   1. Eyelid edema ?etiology.  ?early preseptal cellulitis (note no preceding URI or sinusitis). ?early herpes zoster    Will begin empiric doxycycline.  Begin warm compresses.  May take Ibuprofen 200mg , 4 tabs every 8 hours with food.  Call if rash develops (would begin Valtrex) Followup with ophthalmologist if not improving about 4 days.    Lattie HawStephen A Keirstin Musil, MD 10/24/13 [phone]

## 2013-10-24 NOTE — ED Notes (Signed)
Reports onset of tenderness under and at outer corner of left eye 3 days ago; has gotten progressively more edematous and tender to touch. States had similar occurrence about 3 months ago and was evaluated and treated by Dr. Karie Schwalbe for "pre-orbital cellulitis" with antibiotics after doing a scan of the orbit.

## 2013-10-24 NOTE — Discharge Instructions (Signed)
Begin warm compresses.  May take Ibuprofen 200mg , 4 tabs every 8 hours with food.  Call if rash develops.

## 2013-10-27 ENCOUNTER — Telehealth: Payer: Self-pay | Admitting: Emergency Medicine

## 2013-10-27 NOTE — ED Notes (Signed)
Inquired about patient's status; encourage them to call with questions/concerns.  

## 2014-02-27 ENCOUNTER — Encounter (HOSPITAL_COMMUNITY): Payer: Self-pay | Admitting: Emergency Medicine

## 2014-02-27 ENCOUNTER — Emergency Department (INDEPENDENT_AMBULATORY_CARE_PROVIDER_SITE_OTHER)
Admission: EM | Admit: 2014-02-27 | Discharge: 2014-02-27 | Disposition: A | Discharge status: Home / Self Care | Payer: 59 | Source: Home / Self Care | Attending: Family Medicine | Admitting: Family Medicine

## 2014-02-27 DIAGNOSIS — R42 Dizziness and giddiness: Secondary | ICD-10-CM

## 2014-02-27 DIAGNOSIS — S060X0A Concussion without loss of consciousness, initial encounter: Secondary | ICD-10-CM

## 2014-02-27 MED ORDER — MECLIZINE HCL 50 MG PO TABS: 50.0000 mg | ORAL_TABLET | Freq: Three times a day (TID) | ORAL | Status: DC | PRN

## 2014-02-27 NOTE — ED Provider Notes (Signed)
CSN: [id number]     Arrival date & time 02/27/14  1516 History   First MD Initiated Contact with Patient 02/27/14 1606     Chief Complaint  Patient presents with  . Dizziness   (Consider location/radiation/quality/duration/timing/severity/associated sxs/prior Treatment) HPI  DIzziness: woke up this morning to a spinning room. Associated w/ nausea. Granola bar and water this morning w/ improvement. Felt groggy and disoriented all day. Room no longer spinning. Husband accidentally headbutted pt in nose about 50 hours before symptoms. Unable to see for 15 min at that time. Getting better overall. Worse w/ going from lying to standing    Past Medical History  Diagnosis Date  . Asthma   . Blood transfusion without reported diagnosis   . Anxiety   . Depression   . Asthma, mild persistent 05/12/2013  . Anxiety and depression 05/12/2013  . Migraines     aura, once monthly  . Anemia     in the past   Past Surgical History  Procedure Laterality Date  . Wisdom tooth extraction  2008  . Lumbar puncture      pt had a blood transfusion in 1991   Family History  Problem Relation Age of Onset  . Skin cancer Mother   . Hyperlipidemia Mother   . Allergies Mother   . Asthma Mother   . Rheum arthritis Maternal Aunt   . Stroke Maternal Grandmother    History  Substance Use Topics  . Smoking status: Never Smoker   . Smokeless tobacco: Never Used  . Alcohol Use: Yes     Comment: 1-2 drink a month   OB History   Grav Para Term Preterm Abortions TAB SAB Ect Mult Living   0 0 0 0 0 0 0 0 0 0      Review of Systems Per HPI with all other pertinent systems negative.   Allergies  Influenza vaccines; Tamiflu; and Sulfa antibiotics  Home Medications   Prior to Admission medications   Medication Sig Start Date End Date Taking? Authorizing Provider  doxycycline (VIBRAMYCIN) 100 MG capsule Take 1 capsule (100 mg total) by mouth 2 (two) times daily. 10/24/13   Lattie HawStephen A Beese, MD   famotidine (PEPCID) 20 MG tablet One at bedtime 09/16/13   Nyoka CowdenMichael B Wert, MD  meclizine (ANTIVERT) 50 MG tablet Take 1 tablet (50 mg total) by mouth 3 (three) times daily as needed. 02/27/14   Ozella Rocksavid J Jenell Dobransky, MD  mometasone-formoterol [facility](DULERA) 100-5 MCG/ACT AERO Take 2 puffs first thing in am and then another 2 puffs about 12 hours later. 09/16/13   Nyoka CowdenMichael B Wert, MD   BP 122/85  Pulse 97  Temp(Src) 98 F (36.7 C) (Oral)  Resp 16  SpO2 97%  LMP 02/01/2014 Physical Exam  Constitutional: She appears well-developed and well-nourished.  HENT:  Head: Normocephalic.  Eyes: EOM are normal. Pupils are equal, round, and reactive to light.  Neck: Normal range of motion.  Cardiovascular: Normal rate, normal heart sounds and intact distal pulses.   Pulmonary/Chest: Effort normal and breath sounds normal.  Abdominal: Soft. Bowel sounds are normal.  Musculoskeletal: Normal range of motion.  Neurological: She is alert. No cranial nerve deficit. She exhibits normal muscle tone. Coordination normal.  Dix hallpike negative  Psychiatric: She has a normal mood and affect. Her behavior is normal. Judgment and thought content normal.    ED Course  Procedures (including critical care time) Labs Review Labs Reviewed - No data to display  Imaging Review No results found.  MDM   1. Dizziness   2. Concussion, without loss of consciousness, initial encounter    Likely Concussion DIx Hallpike negative so unlikely BPPV Handouts given Antivert if needed Call back if not improving Precautions given and all questions answered  Shelly Flattenavid Novalyn Lajara, MD Family Medicine 02/27/2014, 4:37 PM      Ozella Rocksavid J Janisha Bueso, MD 02/27/14 [phone]

## 2014-02-27 NOTE — ED Notes (Signed)
C/o dizziness and feeling nauseas onset this am "felt like room was spinning" Denies f/v/d, cold sx Alert, no signs of acute distress.

## 2014-02-27 NOTE — Discharge Instructions (Signed)
You likely are suffering from a concussion or an early viral infection Please try to avoid screen time, physical activity, driving, and even reading until your symptoms start to improve Introduce these activities slowly as symptoms improve Please call or come back if you get worse   Concussion A concussion, or closed-head injury, is a brain injury caused by a direct blow to the head or by a quick and sudden movement (jolt) of the head or neck. Concussions are usually not life-threatening. Even so, the effects of a concussion can be serious. If you have had a concussion before, you are more likely to experience concussion-like symptoms after a direct blow to the head.  CAUSES  Direct blow to the head, such as from running into another player during a soccer game, being hit in a fight, or hitting your head on a hard surface.  A jolt of the head or neck that causes the brain to move back and forth inside the skull, such as in a car crash. SIGNS AND SYMPTOMS The signs of a concussion can be hard to notice. Early on, they may be missed by you, family members, and health care providers. You may look fine but act or feel differently. Symptoms are usually temporary, but they may last for days, weeks, or even longer. Some symptoms may appear right away while others may not show up for hours or days. Every head injury is different. Symptoms include:  Mild to moderate headaches that will not go away.  A feeling of pressure inside your head.  Having more trouble than usual:  Learning or remembering things you have heard.  Answering questions.  Paying attention or concentrating.  Organizing daily tasks.  Making decisions and solving problems.  Slowness in thinking, acting or reacting, speaking, or reading.  Getting lost or being easily confused.  Feeling tired all the time or lacking energy (fatigued).  Feeling drowsy.  Sleep disturbances.  Sleeping more than usual.  Sleeping less  than usual.  Trouble falling asleep.  Trouble sleeping (insomnia).  Loss of balance or feeling lightheaded or dizzy.  Nausea or vomiting.  Numbness or tingling.  Increased sensitivity to:  Sounds.  Lights.  Distractions.  Vision problems or eyes that tire easily.  Diminished sense of taste or smell.  Ringing in the ears.  Mood changes such as feeling sad or anxious.  Becoming easily irritated or angry for little or no reason.  Lack of motivation.  Seeing or hearing things other people do not see or hear (hallucinations). DIAGNOSIS Your health care provider can usually diagnose a concussion based on a description of your injury and symptoms. He or she will ask whether you passed out (lost consciousness) and whether you are having trouble remembering events that happened right before and during your injury. Your evaluation might include:  A brain scan to look for signs of injury to the brain. Even if the test shows no injury, you may still have a concussion.  Blood tests to be sure other problems are not present. TREATMENT  Concussions are usually treated in an emergency department, in urgent care, or at a clinic. You may need to stay in the hospital overnight for further treatment.  Tell your health care provider if you are taking any medicines, including prescription medicines, over-the-counter medicines, and natural remedies. Some medicines, such as blood thinners (anticoagulants) and aspirin, may increase the chance of complications. Also tell your health care provider whether you have had alcohol or are taking illegal drugs.  This information may affect treatment.  Your health care provider will send you home with important instructions to follow.  How fast you will recover from a concussion depends on many factors. These factors include how severe your concussion is, what part of your brain was injured, your age, and how healthy you were before the  concussion.  Most people with mild injuries recover fully. Recovery can take time. In general, recovery is slower in older persons. Also, persons who have had a concussion in the past or have other medical problems may find that it takes longer to recover from their current injury. HOME CARE INSTRUCTIONS General Instructions  Carefully follow the directions your health care provider gave you.  Only take over-the-counter or prescription medicines for pain, discomfort, or fever as directed by your health care provider.  Take only those medicines that your health care provider has approved.  Do not drink alcohol until your health care provider says you are well enough to do so. Alcohol and certain other drugs may slow your recovery and can put you at risk of further injury.  If it is harder than usual to remember things, write them down.  If you are easily distracted, try to do one thing at a time. For example, do not try to watch TV while fixing dinner.  Talk with family members or close friends when making important decisions.  Keep all follow-up appointments. Repeated evaluation of your symptoms is recommended for your recovery.  Watch your symptoms and tell others to do the same. Complications sometimes occur after a concussion. Older adults with a brain injury may have a higher risk of serious complications, such as a blood clot on the brain.  Tell your teachers, school nurse, school counselor, coach, athletic trainer, or work Production designer, theatre/television/film about your injury, symptoms, and restrictions. Tell them about what you can or cannot do. They should watch for:  Increased problems with attention or concentration.  Increased difficulty remembering or learning new information.  Increased time needed to complete tasks or assignments.  Increased irritability or decreased ability to cope with stress.  Increased symptoms.  Rest. Rest helps the brain to heal. Make sure you:  Get plenty of sleep at  night. Avoid staying up late at night.  Keep the same bedtime hours on weekends and weekdays.  Rest during the day. Take daytime naps or rest breaks when you feel tired.  Limit activities that require a lot of thought or concentration. These include:  Doing homework or job-related work.  Watching TV.  Working on the computer.  Avoid any situation where there is potential for another head injury (football, hockey, soccer, basketball, martial arts, downhill snow sports and horseback riding). Your condition will get worse every time you experience a concussion. You should avoid these activities until you are evaluated by the appropriate follow-up health care providers. Returning To Your Regular Activities You will need to return to your normal activities slowly, not all at once. You must give your body and brain enough time for recovery.  Do not return to sports or other athletic activities until your health care provider tells you it is safe to do so.  Ask your health care provider when you can drive, ride a bicycle, or operate heavy machinery. Your ability to react may be slower after a brain injury. Never do these activities if you are dizzy.  Ask your health care provider about when you can return to work or school. Preventing Another Concussion It is very  important to avoid another brain injury, especially before you have recovered. In rare cases, another injury can lead to permanent brain damage, brain swelling, or death. The risk of this is greatest during the first 7-10 days after a head injury. Avoid injuries by:  Wearing a seat belt when riding in a car.  Drinking alcohol only in moderation.  Wearing a helmet when biking, skiing, skateboarding, skating, or doing similar activities.  Avoiding activities that could lead to a second concussion, such as contact or recreational sports, until your health care provider says it is okay.  Taking safety measures in your home.  Remove  clutter and tripping hazards from floors and stairways.  Use grab bars in bathrooms and handrails by stairs.  Place non-slip mats on floors and in bathtubs.  Improve lighting in dim areas. SEEK MEDICAL CARE IF:  You have increased problems paying attention or concentrating.  You have increased difficulty remembering or learning new information.  You need more time to complete tasks or assignments than before.  You have increased irritability or decreased ability to cope with stress.  You have more symptoms than before. Seek medical care if you have any of the following symptoms for more than 2 weeks after your injury:  Lasting (chronic) headaches.  Dizziness or balance problems.  Nausea.  Vision problems.  Increased sensitivity to noise or light.  Depression or mood swings.  Anxiety or irritability.  Memory problems.  Difficulty concentrating or paying attention.  Sleep problems.  Feeling tired all the time. SEEK IMMEDIATE MEDICAL CARE IF:  You have severe or worsening headaches. These may be a sign of a blood clot in the brain.  You have weakness (even if only in one hand, leg, or part of the face).  You have numbness.  You have decreased coordination.  You vomit repeatedly.  You have increased sleepiness.  One pupil is larger than the other.  You have convulsions.  You have slurred speech.  You have increased confusion. This may be a sign of a blood clot in the brain.  You have increased restlessness, agitation, or irritability.  You are unable to recognize people or places.  You have neck pain.  It is difficult to wake you up.  You have unusual behavior changes.  You lose consciousness. MAKE SURE YOU:  Understand these instructions.  Will watch your condition.  Will get help right away if you are not doing well or get worse. Document Released: 07/22/2003 Document Revised: 05/06/2013 Document Reviewed: 11/21/2012 [facility] Patient  Information 2015 [place]. This information is not intended to replace advice given to you by your health care provider. Make sure you discuss any questions you have with your health care provider.  Concussion Direct trauma to the head often causes a condition known as a concussion. This injury can temporarily interfere with brain function and may cause you to pass out (lose consciousness). The consequences of a concussion are usually short-term, but repetitive concussions can be very dangerous. If you have multiple concussions, you will have a greater risk of long-term effects, such as slurred speech, slow movements, impaired thinking, or tremors. The severity of a concussion is based on the length and severity of the interference with brain activity. SYMPTOMS  Symptoms of a concussion vary depending on the severity of the injury. Very mild concussions may even occur without any noticeable symptoms. Swelling in the area of the injury is not related to the seriousness of the injury.   Mild concussion:  Temporary loss of consciousness may or may not occur.  Memory loss (amnesia) for a short time.  Emotional instability.  Confusion.  Severe concussion:  Usually prolonged loss of consciousness.  Confusion  One pupil (the black part in the middle of the eye) is larger than the other.  Changes in vision (including blurring).  Changes in breathing.  Disturbed balance (equilibrium).  Headaches.  Confusion.  Nausea or vomiting.  Slower reaction time than normal.  Difficulty learning and remembering things you have heard. CAUSES  A concussion is the result of trauma to the head. When the head is subjected to such an injury, the brain strikes against the inner wall of the skull. This impact is what causes the damage to the brain. The force of injury is related to severity of injury. The most severe concussions are associated with incidents that involve large impact forces such as  motor vehicle accidents. Wearing a helmet will reduce the severity of trauma to the head, but concussions may still occur if you are wearing a helmet. RISK INCREASES WITH:  Contact sports (football, hockey, soccer, rugby, basketball or lacrosse).  Fighting sports (martial arts or boxing).  Riding bicycles, motorcycles, or horses (when you ride without a helmet). PREVENTION  Wear proper protective headgear and ensure correct fit.  Wear seat belts when driving and riding in a car.  Do not drink or use mind-altering drugs and drive. PROGNOSIS  Concussions are typically curable if they are recognized and treated early. If a severe concussion or multiple concussions go untreated, then the complications may be life-threatening or cause permanent disability and brain damage. RELATED COMPLICATIONS   Permanent brain damage (slurred speech, slow movement, impaired thinking, or tremors).  Bleeding under the skull (subdural hemorrhage or hematoma, epidural hematoma).  Bleeding into the brain.  Prolonged healing time if usual activities are resumed too soon.  Infection if skin over the concussion site is broken.  Increased risk of future concussions (less trauma is required for a second concussion than the first). TREATMENT  Treatment initially requires immediate evaluation to determine the severity of the concussion. Occasionally, a hospital stay may be required for observation and treatment.  Avoid exertion. Bed rest for the first 24-48 hours is recommended.  Return to play is a controversial subject due to the increased risk for future injury as well as permanent disability and should be discussed at length with your treating caregiver. Many factors such as the severity of the concussion and whether this is the first, second, or third concussion play a role in timing a patient's return to sports.  MEDICATION  Do not give any medicine, including non-prescription acetaminophen or aspirin,  until the diagnosis is certain. These medicines may mask developing symptoms.  SEEK IMMEDIATE MEDICAL CARE IF:   Symptoms get worse or do not improve in 24 hours.  Any of the following symptoms occur:  Vomiting.  The inability to move arms and legs equally well on both sides.  Fever.  Neck stiffness.  Pupils of unequal size, shape, or reactivity.  Convulsions.  Noticeable restlessness.  Severe headache that persists for longer than 4 hours after injury.  Confusion, disorientation, or mental status changes. Document Released: 05/01/2005 Document Revised: 02/19/2013 Document Reviewed: 08/13/2008 [facility] Patient Information 2015 [place], [place]. This information is not intended to replace advice given to you by your health care provider. Make sure you discuss any questions you have with your health care provider.

## 2014-05-28 ENCOUNTER — Ambulatory Visit (INDEPENDENT_AMBULATORY_CARE_PROVIDER_SITE_OTHER): Payer: 59 | Admitting: Certified Nurse Midwife

## 2014-05-28 ENCOUNTER — Encounter: Payer: Self-pay | Admitting: Certified Nurse Midwife

## 2014-05-28 VITALS — BP 110/72 | HR 70 | Resp 16 | Ht 65.25 in | Wt 161.0 lb

## 2014-05-28 DIAGNOSIS — Z1159 Encounter for screening for other viral diseases: Secondary | ICD-10-CM

## 2014-05-28 DIAGNOSIS — Z3169 Encounter for other general counseling and advice on procreation: Secondary | ICD-10-CM

## 2014-05-28 NOTE — Progress Notes (Signed)
29 yo married white female g0 p0 here for preconceptual consultation. 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 Patient and spouse here to discuss preparation for pregnancy. Patient has been working on diet to include water, and decreased pre prepared foods. 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 Spouse works in [employer] and is exposed to second hand smoke, but uses air filter to help reduce his health risks. Denies environmental risks with housing or chemicals used for cleaning. No current medication use except inhaler prn for asthma. TDAP ? Up to date for both, will check on. Blood type O positive. Patient and spouse deny any family history of genetic defects or congenital problems. No alcohol use. One pet a dog well cared for. Patient admits to no contraception at about the time of ovulation this month. Period due soon.  O: Healthy female, WDWN Affect: normal  A: Preconceptual consultation Possible pregnancy with no contraception in past month  P: Reviewed nutritional needs for pregnancy and becoming pregnant. Discussed importance of daily exercise for good health and pregnancy. Discussed avoidance of alcohol while trying for pregnancy as well as during pregnancy. Recommended daily prenatal vitamins now. Patient prefers OTC. Discussed Rubella screen and agreeable to draw today. Patient and spouse have no financial concerns for pregnancy at this time, and are excited at the thought of starting a family. Questions addressed at length regarding if pregnancy test positive. Patient to call and come to confirm if positive. Aware of importance of prenatal care. Given handout on preconceptual information and ovulation. Lab Rubella screen  30 minutes spent with patient face to face counseling regarding pregnancy and planning for pregnancy.

## 2014-05-28 NOTE — Patient Instructions (Signed)
Prenatal Care  WHAT IS PRENATAL CARE?  Prenatal care means health care during your pregnancy, before your baby is born. It is very important to take care of yourself and your baby during your pregnancy by:   Getting early prenatal care. If you know you are pregnant, or think you might be pregnant, call your health care provider as soon as possible. Schedule a visit for a prenatal exam.  Getting regular prenatal care. Follow your health care provider's schedule for blood and other necessary tests. Do not miss appointments.  Doing everything you can to keep yourself and your baby healthy during your pregnancy.  Getting complete care. Prenatal care should include evaluation of the medical, dietary, educational, psychological, and social needs of you and your significant other. The medical and genetic history of your family and the family of your baby's father should be discussed with your health care provider.  Discussing with your health care provider:  Prescription, over-the-counter, and herbal medicines that you take.  Any history of substance abuse, alcohol use, smoking, and illegal drug use.  Any history of domestic abuse and violence.  Immunizations you have received.  Your nutrition and diet.  The amount of exercise you do.  Any environmental and occupational hazards to which you are exposed.  History of sexually transmitted infections for both you and your partner.  Previous pregnancies you have had. WHY IS PRENATAL CARE SO IMPORTANT?  By regularly seeing your health care provider, you help ensure that problems can be identified early so that they can be treated as soon as possible. Other problems might be prevented. Many studies have shown that early and regular prenatal care is important for the health of mothers and their babies.  HOW CAN I TAKE CARE OF MYSELF WHILE I AM PREGNANT?  Here are ways to take care of yourself and your baby:   Start or continue taking your  multivitamin with 400 micrograms (mcg) of folic acid every day.  Get early and regular prenatal care. It is very important to see a health care provider during your pregnancy. Your health care provider will check at each visit to make sure that you and your baby are healthy. If there are any problems, action can be taken right away to help you and your baby.  Eat a healthy diet that includes:  Fruits.  Vegetables.  Foods low in saturated fat.  Whole grains.  Calcium-rich foods, such as milk, yogurt, and hard cheeses.  Drink 6-8 glasses of liquids a day.  Unless your health care provider tells you not to, try to be physically active for 30 minutes, most days of the week. If you are pressed for time, you can get your activity in through 10-minute segments, three times a day.  Do not smoke, drink alcohol, or use drugs. These can cause long-term damage to your baby. Talk with your health care provider about steps to take to stop smoking. Talk with a member of your faith community, a counselor, a trusted friend, or your health care provider if you are concerned about your alcohol or drug use.  Ask your health care provider before taking any medicine, even over-the-counter medicines. Some medicines are not safe to take during pregnancy.  Get plenty of rest and sleep.  Avoid hot tubs and saunas during pregnancy.  Do not have X-rays taken unless absolutely necessary and with the recommendation of your health care provider. A lead shield can be placed on your abdomen to protect your baby when   X-rays are taken in other parts of your body.  Do not empty the cat litter when you are pregnant. It may contain a parasite that causes an infection called toxoplasmosis, which can cause birth defects. Also, use gloves when working in garden areas used by cats.  Do not eat uncooked or undercooked meats or fish.  Do not eat soft, mold-ripened cheeses (Brie, Camembert, and chevre) or soft, blue-veined  cheese (Danish blue and Roquefort).  Stay away from toxic chemicals like:  Insecticides.  Solvents (some cleaners or paint thinners).  Lead.  Mercury.  Sexual intercourse may continue until the end of the pregnancy, unless you have a medical problem or there is a problem with the pregnancy and your health care provider tells you not to.  Do not wear high-heel shoes, especially during the second half of the pregnancy. You can lose your balance and fall.  Do not take long trips, unless absolutely necessary. Be sure to see your health care provider before going on the trip.  Do not sit in one position for more than 2 hours when on a trip.  Take a copy of your medical records when going on a trip. Know where a hospital is located in the city you are visiting, in case of an emergency.  Most dangerous household products will have pregnancy warnings on their labels. Ask your health care provider about products if you are unsure.  Limit or eliminate your caffeine intake from coffee, tea, sodas, medicines, and chocolate.  Many women continue working through pregnancy. Staying active might help you stay healthier. If you have a question about the safety or the hours you work at your particular job, talk with your health care provider.  Get informed:  Read books.  Watch videos.  Go to childbirth classes for you and your significant other.  Talk with experienced moms.  Ask your health care provider about childbirth education classes for you and your partner. Classes can help you and your partner prepare for the birth of your baby.  Ask about a baby doctor (pediatrician) and methods and pain medicine for labor, delivery, and possible cesarean delivery. HOW OFTEN SHOULD I SEE MY HEALTH CARE PROVIDER DURING PREGNANCY?  Your health care provider will give you a schedule for your prenatal visits. You will have visits more often as you get closer to the end of your pregnancy. An average  pregnancy lasts about 40 weeks.  A typical schedule includes visiting your health care provider:   About once each month during your first 6 months of pregnancy.  Every 2 weeks during the next 2 months.  Weekly in the last month, until the delivery date. Your health care provider will probably want to see you more often if:  You are older than 35 years.  Your pregnancy is high risk because you have certain health problems or problems with the pregnancy, such as:  Diabetes.  High blood pressure.  The baby is not growing on schedule, according to the dates of the pregnancy. Your health care provider will do special tests to make sure you and your baby are not having any serious problems. WHAT HAPPENS DURING PRENATAL VISITS?   At your first prenatal visit, your health care provider will do a physical exam and talk to you about your health history and the health history of your partner and your family. Your health care provider will be able to tell you what date to expect your baby to be born on.  Your   first physical exam will include checks of your blood pressure, measurements of your height and weight, and an exam of your pelvic organs. Your health care provider will do a Pap test if you have not had one recently and will do cultures of your cervix to make sure there is no infection.  At each prenatal visit, there will be tests of your blood, urine, blood pressure, weight, and the progress of the baby will be checked.  At your later prenatal visits, your health care provider will check how you are doing and how your baby is developing. You may have a number of tests done as your pregnancy progresses.  Ultrasound exams are often used to check on your baby's growth and health.  You may have more urine and blood tests, as well as special tests, if needed. These may include amniocentesis to examine fluid in the pregnancy sac, stress tests to check how the baby responds to contractions, or a  biophysical profile to measure your baby's well-being. Your health care provider will explain the tests and why they are necessary.  You should be tested for high blood sugar (gestational diabetes) between the 24th and 28th weeks of your pregnancy.  You should discuss with your health care provider your plans to breastfeed or bottle-feed your baby.  Each visit is also a chance for you to learn about staying healthy during pregnancy and to ask questions. Document Released: 05/04/2003 Document Revised: 05/06/2013 Document Reviewed: 07/16/2013 ExitCare Patient Information 2015 [employer]. This information is not intended to replace advice given to you by your health care provider. Make sure you discuss any questions you have with your health care provider.  

## 2014-05-29 LAB — RUBELLA SCREEN: RUBELLA: 1.06 {index} — AB (ref <0.90)

## 2014-05-29 NOTE — Progress Notes (Signed)
Reviewed personally.  M. Suzanne Amila Callies, MD.  

## 2014-07-14 ENCOUNTER — Encounter: Payer: Self-pay | Admitting: *Deleted

## 2014-07-14 ENCOUNTER — Emergency Department
Admission: EM | Admit: 2014-07-14 | Discharge: 2014-07-14 | Disposition: A | Discharge status: Home / Self Care | Payer: 59 | Source: Home / Self Care | Attending: Emergency Medicine | Admitting: Emergency Medicine

## 2014-07-14 DIAGNOSIS — J029 Acute pharyngitis, unspecified: Secondary | ICD-10-CM | POA: Diagnosis not present

## 2014-07-14 DIAGNOSIS — B349 Viral infection, unspecified: Secondary | ICD-10-CM

## 2014-07-14 LAB — POCT INFLUENZA A/B
INFLUENZA A, POC: NEGATIVE
INFLUENZA B, POC: NEGATIVE

## 2014-07-14 MED ORDER — HYDROCOD POLST-CHLORPHEN POLST 10-8 MG/5ML PO LQCR: 5.0000 mL | Freq: Two times a day (BID) | ORAL | Status: DC

## 2014-07-14 MED ORDER — IBUPROFEN 800 MG PO TABS: 800.0000 mg | ORAL_TABLET | Freq: Three times a day (TID) | ORAL | Status: DC

## 2014-07-14 NOTE — ED Provider Notes (Signed)
CSN: [id number]     Arrival date & time 07/14/14  1237 History   First MD Initiated Contact with Patient 07/14/14 1328     Chief Complaint  Patient presents with  . Fever  . Nasal Congestion   (Consider location/radiation/quality/duration/timing/severity/associated sxs/prior Treatment) Patient is a 29 y.o. female presenting with fever. The history is provided by the patient. No language interpreter was used.  Fever Max temp prior to arrival:  100 Temp source:  Oral Severity:  Moderate Onset quality:  Gradual Duration:  1 day Timing:  Constant Progression:  Worsening Chronicity:  New Relieved by:  Nothing Worsened by:  Nothing tried Ineffective treatments:  None tried Associated symptoms: rhinorrhea   Associated symptoms: no chest pain and no dysuria     Past Medical History  Diagnosis Date  . Asthma   . Blood transfusion without reported diagnosis   . Anxiety   . Depression   . Asthma, mild persistent 05/12/2013  . Anxiety and depression 05/12/2013  . Migraines     aura, once monthly  . Anemia     in the past  . Concussion    Past Surgical History  Procedure Laterality Date  . Wisdom tooth extraction  2008  . Lumbar puncture      pt had a blood transfusion in 1991   Family History  Problem Relation Age of Onset  . Skin cancer Mother   . Hyperlipidemia Mother   . Allergies Mother   . Asthma Mother   . Rheum arthritis Maternal Aunt   . Stroke Maternal Grandmother    History  Substance Use Topics  . Smoking status: Never Smoker   . Smokeless tobacco: Never Used  . Alcohol Use: No   OB History    Gravida Para Term Preterm AB TAB SAB Ectopic Multiple Living       Review of Systems  Constitutional: Positive for fever.  HENT: Positive for rhinorrhea.   Cardiovascular: Negative for chest pain.  Genitourinary: Negative for dysuria.  All other systems reviewed and are negative.   Allergies  Dilaudid; Influenza vaccines; Tamiflu; and  Sulfa antibiotics  Home Medications   Prior to Admission medications   Medication Sig Start Date End Date Taking? Authorizing Provider  chlorpheniramine-HYDROcodone (TUSSIONEX PENNKINETIC ER) 10-8 MG/5ML LQCR Take 5 mLs by mouth 2 (two) times daily. 07/14/14   Elson Areas, PA-C  ibuprofen (ADVIL,MOTRIN) 800 MG tablet Take 1 tablet (800 mg total) by mouth 3 (three) times daily. 07/14/14   Elson Areas, PA-C  mometasone-formoterol [facility]) 100-5 MCG/ACT AERO Take 2 puffs first thing in am and then another 2 puffs about 12 hours later. 09/16/13   Nyoka Cowden, MD   BP 100/71 mmHg  Pulse 133  Temp(Src) 99.5 F (37.5 C) (Oral)  Resp 18  Ht  (1.651 m)  Wt 156 lb (70.761 kg)  BMI 25.96 kg/m2  SpO2 100%  LMP 06/27/2014 Physical Exam  Constitutional: She is oriented to person, place, and time. She appears well-developed.  HENT:  Head: Normocephalic.  Right Ear: External ear normal.  Left Ear: External ear normal.  Nose: Nose normal.  Eyes: Pupils are equal, round, and reactive to light.  Neck: Normal range of motion.  Cardiovascular: Normal rate and normal heart sounds.   Pulmonary/Chest: Effort normal and breath sounds normal.  Abdominal: Soft. Bowel sounds are normal.  Musculoskeletal: Normal range of motion.  Neurological: She is alert and oriented  to person, place, and time.  Skin: Skin is warm.  Psychiatric: She has a normal mood and affect.  Nursing note and vitals reviewed.   ED Course  Procedures (including critical care time) Labs Review Labs Reviewed  POCT INFLUENZA A/B    Imaging Review No results found.   MDM   1. Viral illness    Pt offered tussionex and ibuprofen.  (Pt reports trying to get pregnant)  Pt is not going to fill.  Pt is going to take tylenol Pt advised to return if any problems.      Cadence Haslam K CampoSofLonia Skinneria, PA-C 07/14/14 [phone]

## 2014-07-14 NOTE — ED Notes (Signed)
Pt c/o runny nose, sinus pressure, fever 100.0 and chills x 1 day.

## 2014-07-14 NOTE — Discharge Instructions (Signed)
Cough, Adult ° A cough is a reflex that helps clear your throat and airways. It can help heal the body or may be a reaction to an irritated airway. A cough may only last 2 or 3 weeks (acute) or may last more than 8 weeks (chronic).  °CAUSES °Acute cough: °· Viral or bacterial infections. °Chronic cough: °· Infections. °· Allergies. °· Asthma. °· Post-nasal drip. °· Smoking. °· Heartburn or acid reflux. °· Some medicines. °· Chronic lung problems (COPD). °· Cancer. °SYMPTOMS  °· Cough. °· Fever. °· Chest pain. °· Increased breathing rate. °· High-pitched whistling sound when breathing (wheezing). °· Colored mucus that you cough up (sputum). °TREATMENT  °· A bacterial cough may be treated with antibiotic medicine. °· A viral cough must run its course and will not respond to antibiotics. °· Your caregiver may recommend other treatments if you have a chronic cough. °HOME CARE INSTRUCTIONS  °· Only take over-the-counter or prescription medicines for pain, discomfort, or fever as directed by your caregiver. Use cough suppressants only as directed by your caregiver. °· Use a cold steam vaporizer or humidifier in your bedroom or home to help loosen secretions. °· Sleep in a semi-upright position if your cough is worse at night. °· Rest as needed. °· Stop smoking if you smoke. °SEEK IMMEDIATE MEDICAL CARE IF:  °· You have pus in your sputum. °· Your cough starts to worsen. °· You cannot control your cough with suppressants and are losing sleep. °· You begin coughing up blood. °· You have difficulty breathing. °· You develop pain which is getting worse or is uncontrolled with medicine. °· You have a fever. °MAKE SURE YOU:  °· Understand these instructions. °· Will watch your condition. °· Will get help right away if you are not doing well or get worse. °Document Released: 10/28/2010 Document Revised: 07/24/2011 Document Reviewed: 10/28/2010 °ExitCare® Patient Information ©2015 [employer]. This information is not intended  to replace advice given to you by your health care provider. Make sure you discuss any questions you have with your health care provider. °Viral Infections °A viral infection can be caused by different types of viruses. Most viral infections are not serious and resolve on their own. However, some infections may cause severe symptoms and may lead to further complications. °SYMPTOMS °Viruses can frequently cause: °· Minor sore throat. °· Aches and pains. °· Headaches. °· Runny nose. °· Different types of rashes. °· Watery eyes. °· Tiredness. °· Cough. °· Loss of appetite. °· Gastrointestinal infections, resulting in nausea, vomiting, and diarrhea. °These symptoms do not respond to antibiotics because the infection is not caused by bacteria. However, you might catch a bacterial infection following the viral infection. This is sometimes called a "superinfection." Symptoms of such a bacterial infection may include: °· Worsening sore throat with pus and difficulty swallowing. °· Swollen neck glands. °· Chills and a high or persistent fever. °· Severe headache. °· Tenderness over the sinuses. °· Persistent overall ill feeling (malaise), muscle aches, and tiredness (fatigue). °· Persistent cough. °· Yellow, green, or brown mucus production with coughing. °HOME CARE INSTRUCTIONS  °· Only take over-the-counter or prescription medicines for pain, discomfort, diarrhea, or fever as directed by your caregiver. °· Drink enough water and fluids to keep your urine clear or pale yellow. Sports drinks can provide valuable electrolytes, sugars, and hydration. °· Get plenty of rest and maintain proper nutrition. Soups and broths with crackers or rice are fine. °SEEK IMMEDIATE MEDICAL CARE IF:  °· You have severe headaches,   shortness of breath, chest pain, neck pain, or an unusual rash. °· You have uncontrolled vomiting, diarrhea, or you are unable to keep down fluids. °· You or your child has an oral temperature above 102° F (38.9° C),  not controlled by medicine. °· Your baby is older than 3 months with a rectal temperature of 102° F (38.9° C) or higher. °· Your baby is 3 months old or younger with a rectal temperature of 100.4° F (38° C) or higher. °MAKE SURE YOU:  °· Understand these instructions. °· Will watch your condition. °· Will get help right away if you are not doing well or get worse. °Document Released: 02/08/2005 Document Revised: 07/24/2011 Document Reviewed: 09/05/2010 °ExitCare® Patient Information ©2015 [employer]. This information is not intended to replace advice given to you by your health care provider. Make sure you discuss any questions you have with your health care provider. ° °

## 2014-08-03 ENCOUNTER — Telehealth: Payer: Self-pay | Admitting: Certified Nurse Midwife

## 2014-08-03 NOTE — Telephone Encounter (Signed)
Patient cycle is 1.5 weeks late. Patient's pregnancy test was negative. Last seen 05/28/14.

## 2014-08-03 NOTE — Telephone Encounter (Signed)
Attempted to reach patient at number provided [phone]. There was no answer and recording states that the voicemail box is currently full. Will try again later.

## 2014-08-03 NOTE — Telephone Encounter (Signed)
Spoke with patient. Patient states that she is one week and one day late for her menses. LMP 2/13-2/18. Patient and husband are currently trying for pregnancy. Took UPT on Saturday which was negative. Advised patient will need to be seen in office with Verner Choleborah S. Leonard CNM for evaluation and possible blood pregnancy test. Patient is agreeable. Requesting a late afternoon appointment due to work schedule. Appointment scheduled for 3/22 at 4pm with Verner Choleborah S. Leonard CNM. Agreeable to date and time.  Routing to provider for final review. Patient agreeable to disposition. Will close encounter

## 2014-08-04 ENCOUNTER — Telehealth: Payer: Self-pay | Admitting: Certified Nurse Midwife

## 2014-08-04 ENCOUNTER — Ambulatory Visit (INDEPENDENT_AMBULATORY_CARE_PROVIDER_SITE_OTHER): Payer: 59 | Admitting: Nurse Practitioner

## 2014-08-04 ENCOUNTER — Ambulatory Visit: Payer: 59 | Admitting: Certified Nurse Midwife

## 2014-08-04 ENCOUNTER — Encounter: Payer: Self-pay | Admitting: Nurse Practitioner

## 2014-08-04 VITALS — BP 110/64 | HR 64 | Ht 65.25 in | Wt 155.0 lb

## 2014-08-04 DIAGNOSIS — N912 Amenorrhea, unspecified: Secondary | ICD-10-CM | POA: Diagnosis not present

## 2014-08-04 LAB — POCT URINE PREGNANCY: PREG TEST UR: NEGATIVE

## 2014-08-04 NOTE — Telephone Encounter (Signed)
Left message regarding upcoming appointment has been canceled and needs to be rescheduled. °

## 2014-08-04 NOTE — Progress Notes (Signed)
Subjective:     Patient ID: Rod Maemily S Krummel, female   DOB: [birth year], 29 y.o.   MRN: [id number]  HPI  This 29 yo MW Fe comes in for a consult visit.  She has been off OCP 02/2013.  They are trying for a pregnancy.  She is very normal off OCP at about 27- 28 days. Menses is always normal and flow is moderate.  She is now late for this cycle by 1.5 weeks.  She denies any new stressors.  She took UPT at home and it was negative.  She would like to get a blood test for confirmation.  She has been on prenatal MVI this week.    Review of Systems  Constitutional: Negative for appetite change, fatigue and unexpected weight change.  Gastrointestinal: Negative.   Genitourinary: Negative.        LMP 2/13 - 2/17/ 2016  Musculoskeletal: Negative.   Skin: Negative.   Neurological: Negative.        Objective:   Physical Exam  Constitutional: She is oriented to person, place, and time. She appears well-developed and well-nourished. No distress.  No exam is indicated today.  Neurological: She is alert and oriented to person, place, and time.  Psychiatric: She has a normal mood and affect. Her behavior is normal. Judgment and thought content normal.   UPT here was negative    Assessment:     amenorrhea Trying for pregnancy    Plan:     Will get serum HCG and follow Continue on Prenatal MVI

## 2014-08-05 LAB — HCG, QUANTITATIVE, PREGNANCY

## 2014-08-06 ENCOUNTER — Encounter: Payer: Self-pay | Admitting: Nurse Practitioner

## 2014-08-06 NOTE — Patient Instructions (Signed)
Will call you with test results.

## 2014-08-09 NOTE — Progress Notes (Signed)
Encounter reviewed by Dr. Brook Silva.  

## 2014-09-14 ENCOUNTER — Telehealth: Payer: Self-pay | Admitting: Certified Nurse Midwife

## 2014-09-14 NOTE — Telephone Encounter (Signed)
Spoke with patient. Patient states that LMP was 08-15-14. Took two UPT over the weekend which were positive. Would like to schedule an appointment for Friday to confirm pregnancy. Appointment scheduled for 5/6 at 9:15 am with Verner Choleborah S. Leonard CNM. Patient is agreeable to date and time.  Routing to provider for final review. Patient agreeable to disposition. Will close encounter.

## 2014-09-14 NOTE — Telephone Encounter (Signed)
Patient had a positive pregnancy test and is asking for an appointment Friday 09/18/14. Last seen 08/04/2014.

## 2014-09-14 NOTE — Telephone Encounter (Signed)
Spoke with patient. Patient states that she will need to return call later this afternoon.

## 2014-09-18 ENCOUNTER — Ambulatory Visit (INDEPENDENT_AMBULATORY_CARE_PROVIDER_SITE_OTHER): Payer: 59 | Admitting: Certified Nurse Midwife

## 2014-09-18 ENCOUNTER — Encounter: Payer: Self-pay | Admitting: Certified Nurse Midwife

## 2014-09-18 VITALS — BP 110/80 | HR 68 | Resp 16 | Ht 65.25 in | Wt 154.0 lb

## 2014-09-18 DIAGNOSIS — Z3201 Encounter for pregnancy test, result positive: Secondary | ICD-10-CM | POA: Diagnosis not present

## 2014-09-18 DIAGNOSIS — N912 Amenorrhea, unspecified: Secondary | ICD-10-CM

## 2014-09-18 LAB — POCT URINE PREGNANCY: PREG TEST UR: POSITIVE

## 2014-09-18 NOTE — Patient Instructions (Signed)
Prenatal Care  WHAT IS PRENATAL CARE?  Prenatal care means health care during your pregnancy, before your baby is born. It is very important to take care of yourself and your baby during your pregnancy by:   Getting early prenatal care. If you know you are pregnant, or think you might be pregnant, call your health care provider as soon as possible. Schedule a visit for a prenatal exam.  Getting regular prenatal care. Follow your health care provider's schedule for blood and other necessary tests. Do not miss appointments.  Doing everything you can to keep yourself and your baby healthy during your pregnancy.  Getting complete care. Prenatal care should include evaluation of the medical, dietary, educational, psychological, and social needs of you and your significant other. The medical and genetic history of your family and the family of your baby's father should be discussed with your health care provider.  Discussing with your health care provider:  Prescription, over-the-counter, and herbal medicines that you take.  Any history of substance abuse, alcohol use, smoking, and illegal drug use.  Any history of domestic abuse and violence.  Immunizations you have received.  Your nutrition and diet.  The amount of exercise you do.  Any environmental and occupational hazards to which you are exposed.  History of sexually transmitted infections for both you and your partner.  Previous pregnancies you have had. WHY IS PRENATAL CARE SO IMPORTANT?  By regularly seeing your health care provider, you help ensure that problems can be identified early so that they can be treated as soon as possible. Other problems might be prevented. Many studies have shown that early and regular prenatal care is important for the health of mothers and their babies.  HOW CAN I TAKE CARE OF MYSELF WHILE I AM PREGNANT?  Here are ways to take care of yourself and your baby:   Start or continue taking your  multivitamin with 400 micrograms (mcg) of folic acid every day.  Get early and regular prenatal care. It is very important to see a health care provider during your pregnancy. Your health care provider will check at each visit to make sure that you and your baby are healthy. If there are any problems, action can be taken right away to help you and your baby.  Eat a healthy diet that includes:  Fruits.  Vegetables.  Foods low in saturated fat.  Whole grains.  Calcium-rich foods, such as milk, yogurt, and hard cheeses.  Drink 6-8 glasses of liquids a day.  Unless your health care provider tells you not to, try to be physically active for 30 minutes, most days of the week. If you are pressed for time, you can get your activity in through 10-minute segments, three times a day.  Do not smoke, drink alcohol, or use drugs. These can cause long-term damage to your baby. Talk with your health care provider about steps to take to stop smoking. Talk with a member of your faith community, a counselor, a trusted friend, or your health care provider if you are concerned about your alcohol or drug use.  Ask your health care provider before taking any medicine, even over-the-counter medicines. Some medicines are not safe to take during pregnancy.  Get plenty of rest and sleep.  Avoid hot tubs and saunas during pregnancy.  Do not have X-rays taken unless absolutely necessary and with the recommendation of your health care provider. A lead shield can be placed on your abdomen to protect your baby when   X-rays are taken in other parts of your body.  Do not empty the cat litter when you are pregnant. It may contain a parasite that causes an infection called toxoplasmosis, which can cause birth defects. Also, use gloves when working in garden areas used by cats.  Do not eat uncooked or undercooked meats or fish.  Do not eat soft, mold-ripened cheeses (Brie, Camembert, and chevre) or soft, blue-veined  cheese (Danish blue and Roquefort).  Stay away from toxic chemicals like:  Insecticides.  Solvents (some cleaners or paint thinners).  Lead.  Mercury.  Sexual intercourse may continue until the end of the pregnancy, unless you have a medical problem or there is a problem with the pregnancy and your health care provider tells you not to.  Do not wear high-heel shoes, especially during the second half of the pregnancy. You can lose your balance and fall.  Do not take long trips, unless absolutely necessary. Be sure to see your health care provider before going on the trip.  Do not sit in one position for more than 2 hours when on a trip.  Take a copy of your medical records when going on a trip. Know where a hospital is located in the city you are visiting, in case of an emergency.  Most dangerous household products will have pregnancy warnings on their labels. Ask your health care provider about products if you are unsure.  Limit or eliminate your caffeine intake from coffee, tea, sodas, medicines, and chocolate.  Many women continue working through pregnancy. Staying active might help you stay healthier. If you have a question about the safety or the hours you work at your particular job, talk with your health care provider.  Get informed:  Read books.  Watch videos.  Go to childbirth classes for you and your significant other.  Talk with experienced moms.  Ask your health care provider about childbirth education classes for you and your partner. Classes can help you and your partner prepare for the birth of your baby.  Ask about a baby doctor (pediatrician) and methods and pain medicine for labor, delivery, and possible cesarean delivery. HOW OFTEN SHOULD I SEE MY HEALTH CARE PROVIDER DURING PREGNANCY?  Your health care provider will give you a schedule for your prenatal visits. You will have visits more often as you get closer to the end of your pregnancy. An average  pregnancy lasts about 40 weeks.  A typical schedule includes visiting your health care provider:   About once each month during your first 6 months of pregnancy.  Every 2 weeks during the next 2 months.  Weekly in the last month, until the delivery date. Your health care provider will probably want to see you more often if:  You are older than 35 years.  Your pregnancy is high risk because you have certain health problems or problems with the pregnancy, such as:  Diabetes.  High blood pressure.  The baby is not growing on schedule, according to the dates of the pregnancy. Your health care provider will do special tests to make sure you and your baby are not having any serious problems. WHAT HAPPENS DURING PRENATAL VISITS?   At your first prenatal visit, your health care provider will do a physical exam and talk to you about your health history and the health history of your partner and your family. Your health care provider will be able to tell you what date to expect your baby to be born on.  Your   first physical exam will include checks of your blood pressure, measurements of your height and weight, and an exam of your pelvic organs. Your health care provider will do a Pap test if you have not had one recently and will do cultures of your cervix to make sure there is no infection.  At each prenatal visit, there will be tests of your blood, urine, blood pressure, weight, and the progress of the baby will be checked.  At your later prenatal visits, your health care provider will check how you are doing and how your baby is developing. You may have a number of tests done as your pregnancy progresses.  Ultrasound exams are often used to check on your baby's growth and health.  You may have more urine and blood tests, as well as special tests, if needed. These may include amniocentesis to examine fluid in the pregnancy sac, stress tests to check how the baby responds to contractions, or a  biophysical profile to measure your baby's well-being. Your health care provider will explain the tests and why they are necessary.  You should be tested for high blood sugar (gestational diabetes) between the 24th and 28th weeks of your pregnancy.  You should discuss with your health care provider your plans to breastfeed or bottle-feed your baby.  Each visit is also a chance for you to learn about staying healthy during pregnancy and to ask questions. Document Released: 05/04/2003 Document Revised: 05/06/2013 Document Reviewed: 07/16/2013 ExitCare Patient Information 2015 [employer]. This information is not intended to replace advice given to you by your health care provider. Make sure you discuss any questions you have with your health care provider.  

## 2014-09-18 NOTE — Progress Notes (Signed)
29 y.o. married female g0p0 presents with amenorrhea with + UPT on 09-12-14. LMP 08-15-14. Planned pregnancy. Complaining of breast tenderness, fatigue, nausea. Denies spotting, bleeding. Having some cramping, but mildcramping which is getting less. Medications she is taking are: prenatal vitamins. Patient has not consumed alcohol since + UPT. Spouse supportive and here with patient. 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 Patient using precautions as required. No other concerns.   O: HPI pertinent to above. Healthy WDWN female Affect: normal, orientation x 3  Last Aex:09/22/13 normal Pap smear: LSIL with colposcopy which indicated same follow up pap due  09/24/14           Rubella screen:immune  A: Amenorrhea with positive UPT  5 wk  per LNMP with EDC 05-22-15 Planned pregnancy History of abnormal pap with LSIL on colpo, needs pap 5/16, patient aware   P: Reviewed with patient importance of prenatal care during pregnancy. Given OB provider list. Reviewed nutrition importance of pregnancy and selecting from all food groups and making sure to have adequate protein intake daily. Discussed avoiding raw or exotic fish, soft cheeses due to risk of bacteria . Discussed concerns with FAS with alcohol use in pregnancy. Discussed increase of IUGR and SIDS with smoking use or second smoke. Reviewed warning signs of early pregnancy and need to advise if occurs. Discussed comfort measures for early pregnancy changes. Offered viability PUS here prior to initiating prenatal care. Patient will advise if plans to have PUS. She will be called with insurance information and scheduled. Spouse works in Designer, multimediat[employer] industry and was concerned with clothing exposure to smoke if concern for patient. Encouraged to just remove clothing if feels may be a concern for patient. Questions addressed at length. 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 Wished well with pregnancy.Patient will call when has OB appointment to advise she is establishing care.  Labs: none  Rv prn  Time spent with patient in face to face counseling regarding pregnancy and prenatal care 40 minutes

## 2014-09-19 NOTE — Progress Notes (Signed)
Reviewed personally.  M. Suzanne Aerionna Moravek, MD.  

## 2014-09-22 ENCOUNTER — Telehealth: Payer: Self-pay | Admitting: Certified Nurse Midwife

## 2014-09-22 NOTE — Telephone Encounter (Signed)
OB records sent to [facility].

## 2014-09-24 ENCOUNTER — Ambulatory Visit: Payer: Self-pay | Admitting: Certified Nurse Midwife

## 2014-10-05 ENCOUNTER — Telehealth: Payer: Self-pay | Admitting: Certified Nurse Midwife

## 2014-10-05 NOTE — Telephone Encounter (Signed)
Spoke with patient. Advised of message as seen below. Patient is agreeable and verbalizes understanding. Patient will return call if symptoms persist to be seen for OV.  Routing to provider for final review. Patient agreeable to disposition. Will close encounter.

## 2014-10-05 NOTE — Telephone Encounter (Signed)
Patient would like to talk with Debbie's nurse regarding nausea she is having. Patient is [redacted] weeks pregnant and would like suggestions for treating her nausea. Last seen 09/18/14.

## 2014-10-05 NOTE — Telephone Encounter (Signed)
Spoke with patient. Patient states since last Monday 5/16 she has been extremely nauseous. Last Monday was throwing up until 2pm. Has not had any vomiting since Monday. Still having nausea every day since. Is only able to eat small amounts of saltines and very bland foods. Is drinking lots of water to stay hydrated. "I am just getting worried since I am barely able to eat anything." [facility] Has not yet seen them for care. Advised I will speak with Verner Choleborah S. Leonard CNM regarding symptoms and further recommendations. Patient is agreeable. Advised to continue drinking plenty of water and eating small bland meals as she is able. Patient is agreeable.

## 2014-10-05 NOTE — Telephone Encounter (Signed)
I would suggest she try Vitamin B6 50 mg twice daily to help with nausea. Small sips of gingerale are also help. Have her try to increase her protein amount and eat frequent small meals and fluid in between.  If no change or vomiting occurring needs OV to evaluate hydration status.

## 2014-10-22 LAB — OB RESULTS CONSOLE RUBELLA ANTIBODY, IGM: Rubella: IMMUNE

## 2014-10-22 LAB — OB RESULTS CONSOLE ABO/RH: RH Type: POSITIVE

## 2014-10-22 LAB — OB RESULTS CONSOLE HIV ANTIBODY (ROUTINE TESTING): HIV: NONREACTIVE

## 2014-10-22 LAB — OB RESULTS CONSOLE HEPATITIS B SURFACE ANTIGEN: Hepatitis B Surface Ag: NEGATIVE

## 2014-10-22 LAB — OB RESULTS CONSOLE RPR: RPR: NONREACTIVE

## 2014-10-22 LAB — OB RESULTS CONSOLE ANTIBODY SCREEN: ANTIBODY SCREEN: NEGATIVE

## 2014-10-26 LAB — OB RESULTS CONSOLE GC/CHLAMYDIA
CHLAMYDIA, DNA PROBE: NEGATIVE
GC PROBE AMP, GENITAL: NEGATIVE

## 2014-12-31 ENCOUNTER — Telehealth: Payer: Self-pay | Admitting: *Deleted

## 2014-12-31 NOTE — Telephone Encounter (Signed)
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 Please advise recall.

## 2014-12-31 NOTE — Telephone Encounter (Signed)
Out of pap recall due to transfer of care.

## 2015-01-01 NOTE — Telephone Encounter (Signed)
Pt removed from current recall.   Closing encounter. 

## 2015-04-26 LAB — OB RESULTS CONSOLE GBS: GBS: NEGATIVE

## 2015-05-16 NOTE — L&D Delivery Note (Signed)
Delivery Note  First Stage: Labor induction with cytotec x 1 dose after midnight and cervical balloon this am Onset of labor at 0930 am Augmentation : none Analgesia /Anesthesia intrapartum: epidural and IV analgesia AROM at 1136 - MSF  Second Stage: Complete dilation at 1215 Onset of pushing at intermittently 1230 until delivery - only consistent pushing in last 45 minutes of second stage FHR second stage category 2  Delivery of a viable female at 1537 by CNM in ROA position loose nuchal cord x1 slid down body at birth Cord double clamped after cessation of pulsation, cut by FOB Cord blood sample collected   Third Stage: Placenta delivered [facility] intact with 3 VC @ 1541 Placenta disposition: hospital disposal Uterine tone firm / bleeding small  Right sulcus and 2nd degree perineal laceration identified  Anesthesia for repair: epidural and 1% xylocaine local Repair 3-0 vicryl locked for vaginal sulcus repair / standard 3-0 perineal for muscle repair and 4-0 vicryl for subcuticular Est. Blood Loss (mL): 100  Complications: none  Mom to postpartum.  Baby to Couplet care / Skin to Skin.  Newborn: Birth Weight: 8 pounds/ 5.5 ounces Apgar Scores: 8-9 Feeding planned: breast  Marlinda Mike CNM, MSN, FACNM 06/03/2015, 4:30 PM

## 2015-05-22 ENCOUNTER — Inpatient Hospital Stay (HOSPITAL_COMMUNITY): Admission: AD | Admit: 2015-05-22 | Payer: 59 | Source: Ambulatory Visit | Admitting: Obstetrics and Gynecology

## 2015-05-31 DIAGNOSIS — Z3A41 41 weeks gestation of pregnancy: Secondary | ICD-10-CM | POA: Diagnosis not present

## 2015-05-31 DIAGNOSIS — O480 Post-term pregnancy: Secondary | ICD-10-CM | POA: Diagnosis not present

## 2015-06-01 ENCOUNTER — Encounter (HOSPITAL_COMMUNITY): Payer: Self-pay | Admitting: *Deleted

## 2015-06-01 ENCOUNTER — Telehealth (HOSPITAL_COMMUNITY): Payer: Self-pay | Admitting: *Deleted

## 2015-06-01 NOTE — Telephone Encounter (Signed)
Preadmission screen  

## 2015-06-03 ENCOUNTER — Inpatient Hospital Stay (HOSPITAL_COMMUNITY): Payer: 59 | Admitting: Anesthesiology

## 2015-06-03 ENCOUNTER — Inpatient Hospital Stay (HOSPITAL_COMMUNITY)
Admission: RE | Admit: 2015-06-03 | Discharge: 2015-06-05 | DRG: 775 | Disposition: A | Discharge status: Home / Self Care | Payer: 59 | Source: Ambulatory Visit | Attending: Obstetrics and Gynecology | Admitting: Obstetrics and Gynecology

## 2015-06-03 ENCOUNTER — Encounter (HOSPITAL_COMMUNITY): Payer: Self-pay

## 2015-06-03 DIAGNOSIS — O99344 Other mental disorders complicating childbirth: Secondary | ICD-10-CM | POA: Diagnosis present

## 2015-06-03 DIAGNOSIS — Z8249 Family history of ischemic heart disease and other diseases of the circulatory system: Secondary | ICD-10-CM

## 2015-06-03 DIAGNOSIS — Z823 Family history of stroke: Secondary | ICD-10-CM | POA: Diagnosis not present

## 2015-06-03 DIAGNOSIS — O9081 Anemia of the puerperium: Secondary | ICD-10-CM | POA: Diagnosis not present

## 2015-06-03 DIAGNOSIS — O99284 Endocrine, nutritional and metabolic diseases complicating childbirth: Secondary | ICD-10-CM | POA: Diagnosis present

## 2015-06-03 DIAGNOSIS — O480 Post-term pregnancy: Principal | ICD-10-CM | POA: Diagnosis present

## 2015-06-03 DIAGNOSIS — D62 Acute posthemorrhagic anemia: Secondary | ICD-10-CM | POA: Diagnosis not present

## 2015-06-03 DIAGNOSIS — F419 Anxiety disorder, unspecified: Secondary | ICD-10-CM | POA: Diagnosis present

## 2015-06-03 DIAGNOSIS — Z3A41 41 weeks gestation of pregnancy: Secondary | ICD-10-CM | POA: Diagnosis not present

## 2015-06-03 DIAGNOSIS — E0580 Other thyrotoxicosis without thyrotoxic crisis or storm: Secondary | ICD-10-CM | POA: Diagnosis present

## 2015-06-03 LAB — RPR: RPR: NONREACTIVE

## 2015-06-03 LAB — CBC
HCT: 32 % — ABNORMAL LOW (ref 36.0–46.0)
HEMOGLOBIN: 10.1 g/dL — AB (ref 12.0–15.0)
MCH: 23.7 pg — AB (ref 26.0–34.0)
MCHC: 31.6 g/dL (ref 30.0–36.0)
MCV: 74.9 fL — AB (ref 78.0–100.0)
PLATELETS: 200 10*3/uL (ref 150–400)
RBC: 4.27 MIL/uL (ref 3.87–5.11)
RDW: 16 % — ABNORMAL HIGH (ref 11.5–15.5)
WBC: 12.2 10*3/uL — ABNORMAL HIGH (ref 4.0–10.5)

## 2015-06-03 LAB — ABO/RH: ABO/RH(D): O POS

## 2015-06-03 LAB — TYPE AND SCREEN
ABO/RH(D): O POS
Antibody Screen: NEGATIVE

## 2015-06-03 MED ORDER — TRAMADOL HCL 50 MG PO TABS
50.0000 mg | ORAL_TABLET | Freq: Four times a day (QID) | ORAL | Status: DC | PRN
Start: 2015-06-03 — End: 2015-06-05

## 2015-06-03 MED ORDER — LIDOCAINE HCL (PF) 1 % IJ SOLN
INTRAMUSCULAR | Status: DC | PRN
  Administered 2015-06-03 (×2): 4 mL via EPIDURAL

## 2015-06-03 MED ORDER — LACTATED RINGERS IV SOLN
INTRAVENOUS | Status: DC
  Administered 2015-06-03 (×2): via INTRAVENOUS

## 2015-06-03 MED ORDER — SODIUM CHLORIDE 0.9 % IV SOLN: 250.0000 mL | INTRAVENOUS | Status: DC | PRN

## 2015-06-03 MED ORDER — OXYTOCIN 10 UNIT/ML IJ SOLN
2.5000 [IU]/h | INTRAVENOUS | Status: DC
  Filled 2015-06-03: qty 4

## 2015-06-03 MED ORDER — ONDANSETRON HCL 4 MG/2ML IJ SOLN: 4.0000 mg | Freq: Four times a day (QID) | INTRAMUSCULAR | Status: DC | PRN

## 2015-06-03 MED ORDER — BENZOCAINE-MENTHOL 20-0.5 % EX AERO
1.0000 | INHALATION_SPRAY | CUTANEOUS | Status: DC | PRN
Start: 2015-06-03 — End: 2015-06-05
  Filled 2015-06-03: qty 56

## 2015-06-03 MED ORDER — ZOLPIDEM TARTRATE 5 MG PO TABS: 5.0000 mg | ORAL_TABLET | Freq: Every evening | ORAL | Status: DC | PRN

## 2015-06-03 MED ORDER — FENTANYL 2.5 MCG/ML BUPIVACAINE 1/10 % EPIDURAL INFUSION (WH - ANES)
14.0000 mL/h | INTRAMUSCULAR | Status: DC | PRN
  Administered 2015-06-03: 11 mL/h via EPIDURAL
  Filled 2015-06-03: qty 125

## 2015-06-03 MED ORDER — WITCH HAZEL-GLYCERIN EX PADS: 1.0000 "application " | MEDICATED_PAD | CUTANEOUS | Status: DC | PRN

## 2015-06-03 MED ORDER — MISOPROSTOL 25 MCG QUARTER TABLET
25.0000 ug | ORAL_TABLET | ORAL | Status: DC | PRN
  Administered 2015-06-03: 25 ug via VAGINAL
  Filled 2015-06-03: qty 0.25

## 2015-06-03 MED ORDER — NALBUPHINE HCL 10 MG/ML IJ SOLN
10.0000 mg | Freq: Once | INTRAMUSCULAR | Status: AC
  Administered 2015-06-03: 10 mg via INTRAVENOUS
  Filled 2015-06-03: qty 1

## 2015-06-03 MED ORDER — SENNOSIDES-DOCUSATE SODIUM 8.6-50 MG PO TABS
2.0000 | ORAL_TABLET | ORAL | Status: DC
  Administered 2015-06-03 – 2015-06-04 (×2): 2 via ORAL
  Filled 2015-06-03 (×2): qty 2

## 2015-06-03 MED ORDER — TRAMADOL HCL 50 MG PO TABS: 50.0000 mg | ORAL_TABLET | Freq: Four times a day (QID) | ORAL | Status: DC

## 2015-06-03 MED ORDER — TERBUTALINE SULFATE 1 MG/ML IJ SOLN
0.2500 mg | Freq: Once | INTRAMUSCULAR | Status: DC | PRN
  Filled 2015-06-03: qty 1

## 2015-06-03 MED ORDER — LANOLIN HYDROUS EX OINT: TOPICAL_OINTMENT | CUTANEOUS | Status: DC | PRN

## 2015-06-03 MED ORDER — LACTATED RINGERS IV SOLN
500.0000 mL | INTRAVENOUS | Status: DC | PRN
  Administered 2015-06-03 (×4): 500 mL via INTRAVENOUS
  Administered 2015-06-03: 1000 mL via INTRAVENOUS

## 2015-06-03 MED ORDER — DIBUCAINE 1 % RE OINT: 1.0000 "application " | TOPICAL_OINTMENT | RECTAL | Status: DC | PRN

## 2015-06-03 MED ORDER — DIPHENHYDRAMINE HCL 50 MG/ML IJ SOLN: 12.5000 mg | INTRAMUSCULAR | Status: DC | PRN

## 2015-06-03 MED ORDER — PHENYLEPHRINE 40 MCG/ML (10ML) SYRINGE FOR IV PUSH (FOR BLOOD PRESSURE SUPPORT)
80.0000 ug | PREFILLED_SYRINGE | INTRAVENOUS | Status: DC | PRN
  Administered 2015-06-03 (×2): 80 ug via INTRAVENOUS
  Filled 2015-06-03 (×2): qty 20
  Filled 2015-06-03: qty 2

## 2015-06-03 MED ORDER — SODIUM CHLORIDE 0.9 % IJ SOLN: 3.0000 mL | Freq: Two times a day (BID) | INTRAMUSCULAR | Status: DC

## 2015-06-03 MED ORDER — LIDOCAINE HCL (PF) 1 % IJ SOLN
30.0000 mL | INTRAMUSCULAR | Status: DC | PRN
  Filled 2015-06-03: qty 30

## 2015-06-03 MED ORDER — CITRIC ACID-SODIUM CITRATE 334-500 MG/5ML PO SOLN: 30.0000 mL | ORAL | Status: DC | PRN

## 2015-06-03 MED ORDER — SODIUM CHLORIDE 0.9 % IJ SOLN: 3.0000 mL | INTRAMUSCULAR | Status: DC | PRN

## 2015-06-03 MED ORDER — OXYTOCIN 10 UNIT/ML IJ SOLN: 10.0000 [IU] | Freq: Once | INTRAMUSCULAR | Status: DC

## 2015-06-03 MED ORDER — OXYTOCIN BOLUS FROM INFUSION
500.0000 mL | INTRAVENOUS | Status: DC
  Administered 2015-06-03: 500 mL via INTRAVENOUS

## 2015-06-03 MED ORDER — IBUPROFEN 600 MG PO TABS
600.0000 mg | ORAL_TABLET | Freq: Four times a day (QID) | ORAL | Status: DC
  Administered 2015-06-03 – 2015-06-05 (×8): 600 mg via ORAL
  Filled 2015-06-03 (×8): qty 1

## 2015-06-03 MED ORDER — DIPHENHYDRAMINE HCL 25 MG PO CAPS: 25.0000 mg | ORAL_CAPSULE | Freq: Four times a day (QID) | ORAL | Status: DC | PRN

## 2015-06-03 MED ORDER — SIMETHICONE 80 MG PO CHEW: 80.0000 mg | CHEWABLE_TABLET | ORAL | Status: DC | PRN

## 2015-06-03 MED ORDER — EPHEDRINE 5 MG/ML INJ
10.0000 mg | INTRAVENOUS | Status: DC | PRN
  Filled 2015-06-03: qty 2

## 2015-06-03 MED ORDER — ACETAMINOPHEN 325 MG PO TABS: 650.0000 mg | ORAL_TABLET | ORAL | Status: DC | PRN

## 2015-06-03 NOTE — Progress Notes (Signed)
S:  Unable to push - screaming and panting with every ctx (despite full re-dose of epidural with perineal block) "cant do it"  /  " Ii dont want to push - it hurts too bad"  Episodes of hyperventilating pushing and kicking / states keeps having panic attack everytime she closes her eyes the pain wakes her up - just wants to rest  O:  VS: Blood pressure 132/74, pulse 124, temperature 98.4 F (36.9 C), temperature source Oral, resp. rate 18, height  (1.676 m), weight 90.719 kg (200 lb), last menstrual period 08/15/2014, SpO2 99 %.        FHR : baseline 150 / variability moderate / accelerations + / variables with some late recovery to some        Toco: contractions every 2-3 minutes / moderate to strong         Cervix : 10 cm at 1215 / vtx +1        Membranes: yellow-green thin MSF  A: second stage of labor     FHR category 2     Uncontrolled pain      Intermittent panic attcks  P: continuous labor support and encouragement with CNM, nurse,family at bedside     Intermittent pushing ~ 10-15 minutes then panics with more pressure and quits pushing until able to re-focus     Expectant management - (+) fetal descent despite inconsistent pushing     Marlinda Mike CNM, MSN, [facility] 06/03/2015, 4:20 PM

## 2015-06-03 NOTE — Anesthesia Procedure Notes (Signed)
Epidural Patient location during procedure: OB Start time: 06/03/2015 10:15 AM End time: 06/03/2015 10:25 AM  Staffing Anesthesiologist: Shona Simpson D Performed by: anesthesiologist   Preanesthetic Checklist Completed: patient identified, site marked, surgical consent, pre-op evaluation, timeout performed, IV checked, risks and benefits discussed and monitors and equipment checked  Epidural Patient position: sitting Prep: ChloraPrep Patient monitoring: heart rate, continuous pulse ox and blood pressure Approach: midline Location: L3-L4 Injection technique: LOR saline  Needle:  Needle type: Tuohy  Needle gauge: 17 G Needle length: 9 cm Catheter type: closed end flexible Catheter size: 20 Guage Test dose: negative and 1.5% lidocaine  Assessment Events: blood not aspirated, injection not painful, no injection resistance and no paresthesia  Additional Notes LOR @ 5  Patient identified. Risks/Benefits/Options discussed with patient including but not limited to bleeding, infection, nerve damage, paralysis, failed block, incomplete pain control, headache, blood pressure changes, nausea, vomiting, reactions to medications, itching and postpartum back pain. Confirmed with bedside nurse the patient's most recent platelet count. Confirmed with patient that they are not currently taking any anticoagulation, have any bleeding history or any family history of bleeding disorders. Patient expressed understanding and wished to proceed. All questions were answered. Sterile technique was used throughout the entire procedure. Please see nursing notes for vital signs. Test dose was given through epidural catheter and negative prior to continuing to dose epidural or start infusion. Warning signs of high block given to the patient including shortness of breath, tingling/numbness in hands, complete motor block, or any concerning symptoms with instructions to call for help. Patient was given instructions on  fall risk and not to get out of bed. All questions and concerns addressed with instructions to call with any issues or inadequate analgesia.    Reason for block:procedure for pain

## 2015-06-03 NOTE — Progress Notes (Addendum)
S:  Pain all night - "cant do this" - states no sleep due to ctx       Desires pain medicine  O:  VS: Blood pressure 130/90, pulse 97, temperature 98.2 F (36.8 C), temperature source Oral, resp. rate 18, height  (1.676 m), weight 90.719 kg (200 lb), last menstrual period 08/15/2014.        FHR : baseline 150 / variability moderate / accelerations + / few variables decelerations        Previous tracing reviewed ~30 minutes of late decels followed by reactive tracing after IVF and O2        Toco: contractions every 2-4 minutes / moderate         Cervix : 2.5cm / 80% / vtx -2 / cervical ballon placed with pain or difficulty -inflated 60ml        Membranes: intact        SONO in office with EFW 9-4  A: latent labor     FHR category 1  P: Discussed cervical balloon - IV medicine to help with pain and allow her to rest some       Epidural after balloon out - AROM and pitocin after epidural   Marlinda Mike CNM, MSN, [facility] 06/03/2015, 8:08 AM

## 2015-06-03 NOTE — Anesthesia Preprocedure Evaluation (Addendum)
Anesthesia Evaluation  Patient identified by MRN, date of birth, ID band Patient awake    Reviewed: Allergy & Precautions, NPO status , Patient's Chart, lab work & pertinent test results  Airway Mallampati: II  TM Distance: >3 FB Neck ROM: Full    Dental  (+) Teeth Intact   Pulmonary asthma ,    breath sounds clear to auscultation       Cardiovascular negative cardio ROS   Rhythm:Regular Rate:Normal     Neuro/Psych  Headaches, PSYCHIATRIC DISORDERS Anxiety Depression    GI/Hepatic negative GI ROS, Neg liver ROS,   Endo/Other  negative endocrine ROS  Renal/GU negative Renal ROS  negative genitourinary   Musculoskeletal negative musculoskeletal ROS (+)   Abdominal   Peds negative pediatric ROS (+)  Hematology   Anesthesia Other Findings   Reproductive/Obstetrics (+) Pregnancy                            Lab Results  Component Value Date   WBC 12.2* 06/03/2015   HGB 10.1* 06/03/2015   HCT 32.0* 06/03/2015   MCV 74.9* 06/03/2015   PLT 200 06/03/2015   No results found for: INR, PROTIME   Anesthesia Physical Anesthesia Plan  ASA: II  Anesthesia Plan: Epidural   Post-op Pain Management:    Induction:   Airway Management Planned:   Additional Equipment:   Intra-op Plan:   Post-operative Plan:   Informed Consent: I have reviewed the patients History and Physical, chart, labs and discussed the procedure including the risks, benefits and alternatives for the proposed anesthesia with the patient or authorized representative who has indicated his/her understanding and acceptance.     Plan Discussed with:   Anesthesia Plan Comments:         Anesthesia Quick Evaluation

## 2015-06-03 NOTE — Lactation Note (Signed)
This note was copied from the chart of Kim Rodriguez. Lactation Consultation Note Initial visit at 6 hours of age.  Mom reports a recent feeding of about 25 minutes.  Mom reports only about 5 minutes on left breast, but baby did well on right.  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  Assisted with hand expression and after a few minutes a few drops noted.  Mom is encouraged by seeing colostrum.  Mom has flat nipples with compressible breast tissue.  LC gave hand pump as a tool to aid in everting nipples only if needed.  Mom is anxious about holding newborn and positioning.  LC assisted with showing cross cradle hold.  Baby latched briefly and then fell asleep in moms arms.  LC encouraged mom with practice she will be more confident.  [facility] LC resources given and discussed.  Encouraged to feed with early cues on demand.  Early newborn behavior discussed.  FOB at bedside supportive.  Mom to call for assist as needed.    Patient Name: Kim Rodriguez [id number] Date: 06/03/2015 Reason for consult: Initial assessment   Maternal Data Has patient been taught Hand Expression?: Yes Does the patient have breastfeeding experience prior to this delivery?: No  Feeding Feeding Type: Breast Fed Length of feed: 0 min  LATCH Score/Interventions Latch: Too sleepy or reluctant, no latch achieved, no sucking elicited.  Audible Swallowing: None  Type of Nipple: Flat Intervention(s): Hand pump  Comfort (Breast/Nipple): Soft / non-tender     Hold (Positioning): Assistance needed to correctly position infant at breast and maintain latch. Intervention(s): Breastfeeding basics reviewed;Support Pillows;Position options;Skin to skin  LATCH Score: 4  Lactation Tools Discussed/Used WIC Program: No Pump Review: Setup, frequency, and cleaning Initiated by:: JS Date initiated:: 06/03/15   Consult Status Consult Status: Follow-up Date: 06/04/15 Follow-up type: In-patient    Kim Rodriguez 06/03/2015, 9:47 PM

## 2015-06-03 NOTE — Progress Notes (Signed)
S:  Feels much better since epidural -feeling pressure in bottom with each ctx  O:  VS: Blood pressure 135/89, pulse 98, temperature 97.6 F (36.4 C), temperature source Axillary, resp. rate 18, height  (1.676 m), weight 90.719 kg (200 lb), last menstrual period 08/15/2014, SpO2 99 %.        FHR : baseline 140 / variability moderate / accelerations + / variable decelerations        Toco: contractions every 2-3 minutes / moderate to strong         Cervix : 9cm / 100% / vtx 0 station         Membranes: BBOW - AROM MSF thin green color  A: active labor     FHR category 2  P: expectant management       passive descent until urge to push      monitor FHR closely    Marlinda Mike CNM, MSN, [facility] 06/03/2015, 11:40 AM

## 2015-06-03 NOTE — Progress Notes (Signed)
Called by RN regarding tracing: late and variable decels. Tracing reviewed-consistent with late and variable decels x25 min, decreased variability Repositioning, IVF bolus, and o2 applied Tracing now Cat I, FHR baseline 145 Continue current plan

## 2015-06-04 LAB — CBC
HCT: 26.6 % — ABNORMAL LOW (ref 36.0–46.0)
Hemoglobin: 8.3 g/dL — ABNORMAL LOW (ref 12.0–15.0)
MCH: 23.6 pg — ABNORMAL LOW (ref 26.0–34.0)
MCHC: 31.2 g/dL (ref 30.0–36.0)
MCV: 75.8 fL — ABNORMAL LOW (ref 78.0–100.0)
Platelets: 179 10*3/uL (ref 150–400)
RBC: 3.51 MIL/uL — ABNORMAL LOW (ref 3.87–5.11)
RDW: 16.4 % — ABNORMAL HIGH (ref 11.5–15.5)
WBC: 14.5 10*3/uL — ABNORMAL HIGH (ref 4.0–10.5)

## 2015-06-04 MED ORDER — MAGNESIUM OXIDE 400 (241.3 MG) MG PO TABS
200.0000 mg | ORAL_TABLET | Freq: Every day | ORAL | Status: DC
  Administered 2015-06-04 – 2015-06-05 (×2): 200 mg via ORAL
  Filled 2015-06-04 (×3): qty 0.5

## 2015-06-04 MED ORDER — POLYSACCHARIDE IRON COMPLEX 150 MG PO CAPS
150.0000 mg | ORAL_CAPSULE | Freq: Two times a day (BID) | ORAL | Status: DC
  Administered 2015-06-04 – 2015-06-05 (×3): 150 mg via ORAL
  Filled 2015-06-04 (×3): qty 1

## 2015-06-04 NOTE — Lactation Note (Signed)
This note was copied from the chart of Kim Rodriguez. Lactation Consultation Note  Patient Name: Kim Mercades Bajaj [id number] Date: 06/04/2015 Reason for consult: Follow-up assessment Baby at 26 hr of life and mom is reporting sore nipples. She has 3 tiny blisters on the L nipple tip and 1 medium blister on the R nipple tip. She is manually expressing after feeding and using her milk on her nipples. Given comfort gels and discussed nipple care along with breast changes.  Baby as thin, tight upper labial frenulum with and insertion point close the bottom of the gum ridge. He has a heart shaped tongue tip when he extends it to gum line, but past gum line. He does not lift tongue to midline, has has poor lateralization, he sucks 5-10 times then pulls tongue in and bites down. There is a noticeable lingual frenulum that has an insertion point slightly forward of mid tongue. 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 She requested more information on tight frenulum and will talk to her Ped about it at the next apt. Mom is aware of OP services and support group. She asked if she could get her UMR DEBP. LC escorted FOB to the [facility]. Given [employer]. The form was signed and a copy of the insurance card was made.    Maternal Data    Feeding Feeding Type: Breast Fed Length of feed: 20 min  LATCH Score/Interventions Latch: Repeated attempts needed to sustain latch, nipple held in mouth throughout feeding, stimulation needed to elicit sucking reflex. Intervention(s): Skin to skin Intervention(s): Breast compression;Adjust position  Audible Swallowing: Spontaneous and intermittent Intervention(s): Hand expression Intervention(s): Alternate breast massage  Type of Nipple: Everted at rest and after stimulation Intervention(s): No intervention needed Intervention(s): No intervention needed  Comfort (Breast/Nipple): Filling, red/small blisters or bruises, mild/mod discomfort  Problem  noted: Cracked, bleeding, blisters, bruises;Severe discomfort;Mild/Moderate discomfort Interventions  (Cracked/bleeding/bruising/blister): Expressed breast milk to nipple Interventions (Mild/moderate discomfort): Comfort gels  Hold (Positioning): Assistance needed to correctly position infant at breast and maintain latch. Intervention(s): Support Pillows;Position options  LATCH Score: 7  Lactation Tools Discussed/Used     Consult Status Consult Status: Follow-up Date: 06/05/15 Follow-up type: In-patient    Rulon Eisenmenger 06/04/2015, 5:50 PM

## 2015-06-04 NOTE — H&P (Signed)
OB ADMISSION/ HISTORY & PHYSICAL:  Admission Date: 06/03/2015 12:10 AM  Admit Diagnosis: 41.[redacted] weeks gestation, post dates  Kim Rodriguez is a 30 y.o. female presenting for IOL.  Prenatal History: G1P1001   EDC:05/22/2015, by Last Menstrual Period   Prenatal care at [facility]  Primary Ob Provider: Marlinda Mike, CNM Prenatal course complicated by excessive wt gain, post dates, significant orbital edema followed by optometrist, subclinical hyperthyroidism, low-lying placenta resolved at 27 wks. EFW 9#  Prenatal Labs: ABO, Rh: O (06/09 0000) POS  Antibody: NEG (01/19 0045) Rubella: Immune (06/09 0000)  RPR: Non Reactive (01/19 0045)  HBsAg: Negative (06/09 0000)  HIV: Non-reactive (06/09 0000)  GBS: Negative (12/12 0000)  1 hr GTT: 114 Genetic screen: declined  Medical / Surgical History :  Past medical history:  Past Medical History  Diagnosis Date  . Asthma   . Blood transfusion without reported diagnosis   . Anxiety   . Depression   . Asthma, mild persistent 05/12/2013  . Anxiety and depression 05/12/2013  . Migraines     aura, once monthly  . Anemia     in the past  . Concussion   . Vaginal Pap smear, abnormal   . Hx of varicella      Past surgical history:  Past Surgical History  Procedure Laterality Date  . Wisdom tooth extraction  2008  . Lumbar puncture      pt had a blood transfusion in 1991    Family History:  Family History  Problem Relation Age of Onset  . Skin cancer Mother   . Hyperlipidemia Mother   . Allergies Mother   . Asthma Mother   . Cancer Mother     skin  . Depression Mother   . Rheum arthritis Maternal Aunt   . Stroke Maternal Grandmother   . Irritable bowel syndrome Maternal Grandmother   . Pyelonephritis Maternal Grandmother   . Hypothyroidism Maternal Grandmother   . Arthritis Maternal Grandmother   . Rheumatic fever Maternal Grandmother   . Hypertension Maternal Grandmother   . Depression Maternal  Grandmother   . Depression Sister   . Cancer Maternal Grandfather     skin  . Thrombophlebitis Maternal Grandfather   . Heart attack Paternal Grandmother      Social History:  reports that she has never smoked. She has never used smokeless tobacco. She reports that she does not drink alcohol or use illicit drugs.  Allergies: Dilaudid; Tamiflu; Influenza vaccines; and Sulfa antibiotics   Current Medications at time of admission:  Prior to Admission medications   Medication Sig Start Date End Date Taking? Authorizing Provider  acetaminophen (TYLENOL) 325 MG tablet Take 325 mg by mouth every 6 (six) hours as needed for mild pain.   Yes Historical Provider, MD  albuterol (PROVENTIL HFA;VENTOLIN HFA) 108 (90 Base) MCG/ACT inhaler Inhale 2 puffs into the lungs every 6 (six) hours as needed for wheezing or shortness of breath.   Yes Historical Provider, MD  Prenatal Vit-Min-FA-Fish Oil ([employer] PRENATAL GUMMY PO) Take 2 tablets by mouth daily.   Yes Historical Provider, MD     Physical Exam: VS: 122/72, 93, 18, 97.8 SVE: 1/thick/-3 exam by RN FHR baseline: 145 bpm, mod variability, +accels, no decels Toco: irregular  Assessment: 41.[redacted] weeks gestation Labor: IOL FHR category I GBS: neg  Plan:  Admit, continuous EFM, Cytotec #1 given, consider cervical balloon and Pitocin, anticipate SVD.    Kim Rodriguez, N MSN, CNM 06/04/2015, 12:41 PM

## 2015-06-04 NOTE — Anesthesia Postprocedure Evaluation (Signed)
Anesthesia Post Note  Patient: SHADOW STIGGERS  Procedure(s) Performed: * No procedures listed *  Patient location during evaluation: Mother Baby Anesthesia Type: Epidural Level of consciousness: awake and alert and oriented Pain management: satisfactory to patient Vital Signs Assessment: post-procedure vital signs reviewed and stable Respiratory status: spontaneous breathing and nonlabored ventilation Cardiovascular status: stable Postop Assessment: no headache, no backache, no signs of nausea or vomiting, adequate PO intake and patient able to bend at knees (patient up walking) Anesthetic complications: no    Last Vitals:  Filed Vitals:   06/03/15 2300 06/04/15 0635  BP: 121/72 138/80  Pulse: 103 103  Temp: 36.9 C 36.8 C  Resp: 18 18    Last Pain:  Filed Vitals:   06/04/15 0636  PainSc: 1                  Keidy Thurgood

## 2015-06-04 NOTE — Progress Notes (Signed)
PPD #1- SVD  Subjective:   Reports feeling ok, "butt is sore" Tolerating po/ No nausea or vomiting Bleeding is light Pain controlled with Motrin Up ad lib / ambulatory / voiding without problems Newborn: breastfeeding  / Circumcision: done  Objective:   VS:  VS:  Filed Vitals:   06/03/15 1745 06/03/15 1850 06/03/15 2300 06/04/15 0635  BP: 142/74 130/82 121/72 138/80  Pulse: 124 115 103 103  Temp: 98.6 F (37 C) 99.1 F (37.3 C) 98.4 F (36.9 C) 98.3 F (36.8 C)  TempSrc: Oral Oral Oral Oral  Resp: Height:      Weight:      SpO2: 100%       LABS:  Recent Labs  06/03/15 0045 06/04/15 0500  WBC 12.2* 14.5*  HGB 10.1* 8.3*  PLT 200 179   Blood type: --/--/O POS, O POS (01/19 0045) Rubella: Immune (06/09 0000)   I&O: Intake/Output      01/19 0701 - 01/20 0700 01/20 0701 - 01/21 0700   Urine (mL/kg/hr) 250 (0.1)    Blood 85 (0)    Total Output 335     Net -335            Physical Exam: Alert and oriented x3 Abdomen: soft, non-tender, non-distended  Fundus: firm, non-tender, U-2 Perineum: Well approximated, no significant erythema, edema, or drainage; healing well. Lochia: small Extremities: 1+ pedal edema, no calf pain or tenderness   Assessment:  PPD #1 G1P1001/ S/P:induced vaginal IDA with compounding ABL anemia  Doing well   Plan: Start Niferex Continue routine post partum orders Anticipate D/C home tomorrow   [name], N MSN, CNM 06/04/2015, 11:23 AM

## 2015-06-05 ENCOUNTER — Ambulatory Visit: Payer: Self-pay

## 2015-06-05 MED ORDER — LORAZEPAM 2 MG PO TABS: 2.0000 mg | ORAL_TABLET | Freq: Two times a day (BID) | ORAL | Status: AC | PRN

## 2015-06-05 MED ORDER — MAGNESIUM OXIDE 400 (241.3 MG) MG PO TABS: 200.0000 mg | ORAL_TABLET | Freq: Every day | ORAL | Status: AC

## 2015-06-05 MED ORDER — POLYSACCHARIDE IRON COMPLEX 150 MG PO CAPS: 150.0000 mg | ORAL_CAPSULE | Freq: Two times a day (BID) | ORAL | Status: AC

## 2015-06-05 MED ORDER — IBUPROFEN 600 MG PO TABS: 600.0000 mg | ORAL_TABLET | Freq: Four times a day (QID) | ORAL | Status: AC

## 2015-06-05 NOTE — Lactation Note (Signed)
This note was copied from the chart of Kim Rodriguez. Lactation Consultation Note Mom having difficulty waking baby to BF. Hasn't BF since 4 am. Stimulated baby and wouldn't wake up. Noted a few shivers when doing STS trying to BF. Notified RN, she called for Glucose lab to be drawn. While lab was drawn, I got baby to latch when cried and suckled at breast for 15 min. Fitted mom w/#16 NS, baby latched well to NS, noted colostrum transfer in NS. Mom had concern regarding tongue tie, MD looked at tongue and decided it was OK for now. Mom has comfort gels, applied after nursing. Gave shells to wear to assist nipple to erect more, also for soreness. Set up DEBP, also got one as well from insurance company w/[employer]. After 15 min. Feeding, baby was sleeping soundly again. A lot of teaching done to mom and family. Hand expressed 3 ml from Rt. Breast. Explained w/teach back instructions.  Patient Name: Kim Rodriguez [id number] Date: 06/05/2015 Reason for consult: Follow-up assessment;Breast/nipple pain   Maternal Data    Feeding Feeding Type: Breast Fed Length of feed: 15 min  LATCH Score/Interventions Latch: Repeated attempts needed to sustain latch, nipple held in mouth throughout feeding, stimulation needed to elicit sucking reflex. Intervention(s): Skin to skin;Teach feeding cues;Waking techniques Intervention(s): Breast massage;Breast compression  Audible Swallowing: Spontaneous and intermittent Intervention(s): Hand expression Intervention(s): Alternate breast massage;Hand expression  Type of Nipple: Everted at rest and after stimulation Intervention(s): Double electric pump;Reverse pressure  Comfort (Breast/Nipple): Engorged, cracked, bleeding, large blisters, severe discomfort Problem noted: Engorgment;Cracked, bleeding, blisters, bruises Intervention(s): Ice;Hand expression Intervention(s): Expressed breast milk to nipple;Double electric pump  Problem noted: Mild/Moderate  discomfort Interventions (Filling): Massage;Firm support;Frequent nursing;Double electric pump Interventions  (Cracked/bleeding/bruising/blister): Double electric pump;Expressed breast milk to nipple Interventions (Mild/moderate discomfort): Hand massage;Hand expression;Pre-pump if needed;Comfort gels Interventions (Severe discomfort): Double electric pum  Hold (Positioning): Assistance needed to correctly position infant at breast and maintain latch. Intervention(s): Breastfeeding basics reviewed;Support Pillows;Position options  LATCH Score: 6  Lactation Tools Discussed/Used Tools: Comfort gels;Pump Nipple shield size: 16;20 Shell Type: Inverted Breast pump type: Double-Electric Breast Pump Pump Review: Setup, frequency, and cleaning;Milk Storage Initiated by:: LC Date initiated:: 06/05/15   Consult Status Consult Status: Follow-up Date: 06/05/15 Follow-up type: In-patient    Charyl Dancer 06/05/2015, 12:32 PM

## 2015-06-05 NOTE — Progress Notes (Signed)
Patient ID: Kim Rodriguez, female   DOB: 09-23-1985, 30 y.o.   MRN: [id number] Post Partum Day #2            Information for the patient's newborn:  Amillion, Scobee [[id number]]  female   / circumcision done Feeding: breast  Subjective: No HA, SOB, CP, F/C, breast symptoms. Pain well-controlled with ibuprofen. Normal vaginal bleeding, no clots. Has anxiety hx - requesting Lorazepam Rx to take prn in PP period.     Objective:  Temp:  [98.1 F (36.7 C)-98.3 F (36.8 C)] 98.1 F (36.7 C) (01/21 0700) Pulse Rate:  [101-103] 101 (01/21 0700) Resp:  [18] 18 (01/21 0700) BP: (97-127)/(54-82) 97/54 mmHg (01/21 0700)      Recent Labs  06/03/15 0045 06/04/15 0500  WBC 12.2* 14.5*  HGB 10.1* 8.3*  HCT 32.0* 26.6*  PLT 200 179    Blood type: O POS (01/19 0045) Rubella: Immune (06/09 0000)    Physical Exam:  General: alert, cooperative, fatigued and no distress Uterine Fundus: firm, midline, U-1 Lochia: appropriate Perineum: 2nd degree repair healing well, edema none DVT Evaluation: No evidence of DVT seen on physical exam. Negative Homan's sign. No cords or calf tenderness. No significant calf/ankle edema. Trace Calf/Ankle edema is present.    Assessment/Plan: PPD # 2 / 30 y.o., G1P1001 S/P: induced vaginal with 2nd degree perineal and RT sulcus lacerations  Principal Problem:    Postpartum care following vaginal delivery (1/19)  Active Problems:    Post-dates pregnancy, delivered  IDA with compounding ABL   Anxiety   normal postpartum exam  Continue current postpartum care  Continue Niferex 150 mg BID  Continue Magnesium Oxide 200 mg daily  Lorazepam 2 mg BID prn anxiety  D/C home   LOS: 2 days   Raelyn Mora, M, MSN, CNM 06/05/2015, 8:23 AM

## 2015-06-05 NOTE — Discharge Summary (Signed)
OB Discharge Summary     Patient Name: Kim Rodriguez DOB: 1985-10-27 MRN: [id number]  Date of admission: 06/03/2015 Delivering MD: Marlinda Mike   Date of discharge: 06/05/2015  Admitting diagnosis: INDUCTION / postterm pregnancy Intrauterine pregnancy: [redacted]w[redacted]d     Secondary diagnosis:  Principal Problem:   Postpartum care following vaginal delivery (1/19) Active Problems:   Post-dates pregnancy  Additional problems: none     Discharge diagnosis: Term Pregnancy Delivered                                                                                                Post partum procedures:none  Augmentation: AROM, Pitocin and Foley Balloon  Complications: None  Hospital course:  Induction of Labor With Vaginal Delivery   30 y.o. yo G1P1001 at [redacted]w[redacted]d was admitted to the hospital 06/03/2015 for induction of labor.  Indication for induction: Postdates.  Patient had an uncomplicated labor course as follows: Membrane Rupture Time/Date: 11:36 AM ,06/03/2015   Intrapartum Procedures: Episiotomy: None [1]                                         Lacerations:  2nd degree [3];Sulcus [9]  Patient had delivery of a Viable infant.  Information for the patient's newborn:  Jazzmen, Restivo [[id number]]  Delivery Method: Vaginal, Spontaneous Delivery (Filed from Delivery Summary)   06/03/2015  Details of delivery can be found in separate delivery note.  Patient had a routine postpartum course. Patient is discharged home 06/05/2015.   Physical exam  Filed Vitals:   06/03/15 2300 06/04/15 0635 06/04/15 1841 06/05/15 0700  BP: 121/72 138/80 127/82 97/54  Pulse: 103 103 103 101  Temp: 98.4 F (36.9 C) 98.3 F (36.8 C) 98.3 F (36.8 C) 98.1 F (36.7 C)  TempSrc: Oral Oral Oral   Resp: Height:      Weight:      SpO2:       General: alert, cooperative and no distress Lochia: appropriate Uterine Fundus: firm Perineum: Healing well with no significant drainage, No significant  erythema DVT Evaluation: No evidence of DVT seen on physical exam. Negative Homan's sign. No cords or calf tenderness. Calf/Ankle edema is present Labs: Lab Results  Component Value Date   WBC 14.5* 06/04/2015   HGB 8.3* 06/04/2015   HCT 26.6* 06/04/2015   MCV 75.8* 06/04/2015   PLT 179 06/04/2015   No flowsheet data found.  Discharge instruction: per After Visit Summary and "Baby and Me Booklet".  After visit meds:    Medication List    TAKE these medications        acetaminophen 325 MG tablet  Commonly known as:  TYLENOL  Take 325 mg by mouth every 6 (six) hours as needed for mild pain.     albuterol 108 (90 Base) MCG/ACT inhaler  Commonly known as:  PROVENTIL HFA;VENTOLIN HFA  Inhale 2 puffs into the lungs every 6 (six) hours as needed for wheezing or shortness of breath.  [employer] PRENATAL GUMMY PO  Take 2 tablets by mouth daily.     ibuprofen 600 MG tablet  Commonly known as:  ADVIL,MOTRIN  Take 1 tablet (600 mg total) by mouth every 6 (six) hours.     iron polysaccharides 150 MG capsule  Commonly known as:  NIFEREX  Take 1 capsule (150 mg total) by mouth 2 (two) times daily.     LORazepam 2 MG tablet  Commonly known as:  ATIVAN  Take 1 tablet (2 mg total) by mouth 2 (two) times daily as needed for anxiety.     magnesium oxide 400 (241.3 Mg) MG tablet  Commonly known as:  MAG-OX  Take 0.5 tablets (200 mg total) by mouth daily.        Diet: routine diet  Activity: Advance as tolerated. Pelvic rest for 6 weeks.   Outpatient follow up:6 weeks Follow up Appt:No future appointments. Follow up Visit:No Follow-up on file.  Postpartum contraception: Abstinence  Newborn Data: Live born female on 06/03/2015 Birth Weight: 8 lb 5.5 oz (3785 g) APGAR: 8, 9  Baby Feeding: Breast Disposition:home with mother   06/05/2015 Raelyn Mora, Judie Petit, CNM

## 2015-06-05 NOTE — Lactation Note (Signed)
This note was copied from the chart of Kim Rodriguez. Lactation Consultation Note Moms breast are filling tight, veins prominent, at verge of engorgement. Stated baby cluster fed all night, hasn't BF since 0400. Has been very sleepy. No void since yesterday morning before circumcision. Had stools. Mom had questions if baby had tongue tie, stated she had one and she can't protrude her tongue past her lips. Noted thick labial frenulum, mid posterior frenulum, but can raise tongue to roof of mouth. Mom has blisters to nipples. Comfort gels given. DEBP set up to relieve engorgement. Engorgement management and prevention discussed. Saw teaching service MD, informed of mom's concerns. Patient Name: Kim Rodriguez [id number] Date: 06/05/2015 Reason for consult: Follow-up assessment;Breast/nipple pain   Maternal Data    Feeding Feeding Type: Breast Milk Length of feed: 0 min  LATCH Score/Interventions Intervention(s): Breast massage;Breast compression  Intervention(s): Hand expression Intervention(s): Alternate breast massage;Hand expression  Type of Nipple: Everted at rest and after stimulation Intervention(s): Double electric pump;Reverse pressure  Comfort (Breast/Nipple): Engorged, cracked, bleeding, large blisters, severe discomfort Problem noted: Engorgment;Cracked, bleeding, blisters, bruises Intervention(s): Ice;Hand expression Intervention(s): Expressed breast milk to nipple;Double electric pump  Problem noted: Mild/Moderate discomfort Interventions  (Cracked/bleeding/bruising/blister): Double electric pump;Expressed breast milk to nipple Interventions (Mild/moderate discomfort): Hand massage;Hand expression;Pre-pump if needed;Comfort gels Interventions (Severe discomfort): Double electric pum  Hold (Positioning): Assistance needed to correctly position infant at breast and maintain latch. Intervention(s): Breastfeeding basics reviewed;Support Pillows;Position options      Lactation Tools Discussed/Used Tools: Comfort gels;Pump Breast pump type: Double-Electric Breast Pump Pump Review: Setup, frequency, and cleaning;Milk Storage Initiated by:: LC Date initiated:: 06/05/15   Consult Status Consult Status: Follow-up Date: 06/05/15 Follow-up type: In-patient    Charyl Dancer 06/05/2015, 10:19 AM

## 2015-06-05 NOTE — Discharge Instructions (Signed)
Breast Pumping Tips °If you are breastfeeding, there may be times when you cannot feed your baby directly. Returning to work or going on a trip are common examples. Pumping allows you to store breast milk and feed it to your baby later.  °You may not get much milk when you first start to pump. Your breasts should start to make more after a few days. If you pump at the times you usually feed your baby, you may be able to keep making enough milk to feed your baby without also using formula. The more often you pump, the more milk you will produce.  °WHEN SHOULD I PUMP?  °· You can begin to pump soon after delivery. However, some experts recommend waiting about 4 weeks before giving your infant a bottle to make sure breastfeeding is going well.  °· If you plan to return to work, begin pumping a few weeks before. This will help you develop techniques that work best for you. It also lets you build up a supply of breast milk.   °· When you are with your infant, feed on demand and pump after each feeding.   °· When you are away from your infant for several hours, pump for about 15 minutes every 2-3 hours. Pump both breasts at the same time if you can.   °· If your infant has a formula feeding, make sure to pump around the same time.     °· If you drink any alcohol, wait 2 hours before pumping.   °HOW DO I PREPARE TO PUMP? °Your let-down reflex is the natural reaction to stimulation that makes your breast milk flow. It is easier to stimulate this reflex when you are relaxed. Find relaxation techniques that work for you. If you have difficulty with your let-down reflex, try these methods:  °· Smell one of your infant's blankets or an item of clothing.   °· Look at a picture or video of your infant.   °· Sit in a quiet, private space.   °· Massage the breast you plan to pump.   °· Place soothing warmth on the breast.   °· Play relaxing music.   °WHAT ARE SOME GENERAL BREAST PUMPING TIPS? °· Wash your hands before you pump. You  do not need to wash your nipples or breasts. °· There are three ways to pump. °¨ You can use your hand to massage and compress your breast. °¨ You can use a handheld manual pump. °¨ You can use an electric pump.   °· Make sure the suction cup (flange) on the breast pump is the right size. Place the flange directly over the nipple. If it is the wrong size or placed the wrong way, it may be painful and cause nipple damage.   °· If pumping is uncomfortable, apply a small amount of purified or modified lanolin to your nipple and areola. °· If you are using an electric pump, adjust the speed and suction power to be more comfortable. °· If pumping is painful or if you are not getting very much milk, you may need a different type of pump. A lactation consultant can help you determine what type of pump to use.   °· Keep a full water bottle near you at all times. Drinking lots of fluid helps you make more milk.  °· You can store your milk to use later. Pumped breast milk can be stored in a sealable, sterile container or plastic bag. Label all stored breast milk with the date you pumped it. °¨ Milk can stay out at room temperature for up to 8 hours. °¨   You can store your milk in the refrigerator for up to 8 days. °¨ You can store your milk in the freezer for 3 months. Thaw frozen milk using warm water. Do not put it in the microwave. °· Do not smoke. Smoking can lower your milk supply and harm your infant. If you need help quitting, ask your health care provider to recommend a program.   °WHEN SHOULD I CALL MY HEALTH CARE PROVIDER OR A LACTATION CONSULTANT? °· You are having trouble pumping. °· You are concerned that you are not making enough milk. °· You have nipple pain, soreness, or redness. °· You want to use birth control. Birth control pills may lower your milk supply. Talk to your health care provider about your options. °  °This information is not intended to replace advice given to you by your health care provider.  Make sure you discuss any questions you have with your health care provider. °  °Document Released: 10/19/2009 Document Revised: 05/06/2013 Document Reviewed: 02/21/2013 °[employer]vier Interactive Patient Education ©2016 [employer] °Postpartum Depression and Baby Blues °The postpartum period begins right after the birth of a baby. During this time, there is often a great amount of joy and excitement. It is also a time of many changes in the life of the parents. Regardless of how many times a mother gives birth, each child brings new challenges and dynamics to the family. It is not unusual to have feelings of excitement along with confusing shifts in moods, emotions, and thoughts. All mothers are at risk of developing postpartum depression or the "baby blues." These mood changes can occur right after giving birth, or they may occur many months after giving birth. The baby blues or postpartum depression can be mild or severe. Additionally, postpartum depression can go away rather quickly, or it can be a long-term condition.  °CAUSES °Raised hormone levels and the rapid drop in those levels are thought to be a main cause of postpartum depression and the baby blues. A number of hormones change during and after pregnancy. Estrogen and progesterone usually decrease right after the delivery of your baby. The levels of thyroid hormone and various cortisol steroids also rapidly drop. Other factors that play a role in these mood changes include major life events and genetics.  °RISK FACTORS °If you have any of the following risks for the baby blues or postpartum depression, know what symptoms to watch out for during the postpartum period. Risk factors that may increase the likelihood of getting the baby blues or postpartum depression include: °· Having a personal or family history of depression.   °· Having depression while being pregnant.   °· Having premenstrual mood issues or mood issues related to oral  contraceptives. °· Having a lot of life stress.   °· Having marital conflict.   °· Lacking a social support network.   °· Having a baby with special needs.   °· Having health problems, such as diabetes.   °SIGNS AND SYMPTOMS °Symptoms of baby blues include: °· Brief changes in mood, such as going from extreme happiness to sadness. °· Decreased concentration.   °· Difficulty sleeping.   °· Crying spells, tearfulness.   °· Irritability.   °· Anxiety.   °Symptoms of postpartum depression typically begin within the first month after giving birth. These symptoms include: °· Difficulty sleeping or excessive sleepiness.   °· Marked weight loss.   °· Agitation.   °· Feelings of worthlessness.   °· Lack of interest in activity or food.   °Postpartum psychosis is a very serious condition and can be dangerous. Fortunately, it is   rare. Displaying any of the following symptoms is cause for immediate medical attention. Symptoms of postpartum psychosis include:  °· Hallucinations and delusions.   °· Bizarre or disorganized behavior.   °· Confusion or disorientation.   °DIAGNOSIS  °A diagnosis is made by an evaluation of your symptoms. There are no medical or lab tests that lead to a diagnosis, but there are various questionnaires that a health care provider may use to identify those with the baby blues, postpartum depression, or psychosis. Often, a screening tool called the Edinburgh Postnatal Depression Scale is used to diagnose depression in the postpartum period.  °TREATMENT °The baby blues usually goes away on its own in 1-2 weeks. Social support is often all that is needed. You will be encouraged to get adequate sleep and rest. Occasionally, you may be given medicines to help you sleep.  °Postpartum depression requires treatment because it can last several months or longer if it is not treated. Treatment may include individual or group therapy, medicine, or both to address any social, physiological, and psychological factors  that may play a role in the depression. Regular exercise, a healthy diet, rest, and social support may also be strongly recommended.  °Postpartum psychosis is more serious and needs treatment right away. Hospitalization is often needed. °HOME CARE INSTRUCTIONS °· Get as much rest as you can. Nap when the baby sleeps.   °· Exercise regularly. Some women find yoga and walking to be beneficial.   °· Eat a balanced and nourishing diet.   °· Do little things that you enjoy. Have a cup of tea, take a bubble bath, read your favorite magazine, or listen to your favorite music. °· Avoid alcohol.   °· Ask for help with household chores, cooking, grocery shopping, or running errands as needed. Do not try to do everything.   °· Talk to people close to you about how you are feeling. Get support from your partner, family members, friends, or other new moms. °· Try to stay positive in how you think. Think about the things you are grateful for.   °· Do not spend a lot of time alone.   °· Only take over-the-counter or prescription medicine as directed by your health care provider. °· Keep all your postpartum appointments.   °· Let your health care provider know if you have any concerns.   °SEEK MEDICAL CARE IF: °You are having a reaction to or problems with your medicine. °SEEK IMMEDIATE MEDICAL CARE IF: °· You have suicidal feelings.   °· You think you may harm the baby or someone else. °MAKE SURE YOU: °· Understand these instructions. °· Will watch your condition. °· Will get help right away if you are not doing well or get worse. °  °This information is not intended to replace advice given to you by your health care provider. Make sure you discuss any questions you have with your health care provider. °  °Document Released: 02/03/2004 Document Revised: 05/06/2013 Document Reviewed: 02/10/2013 °Elsevier Interactive Patient Education ©2016 [employer] °Postpartum Care After Vaginal Delivery °After you deliver your newborn  (postpartum period), the usual stay in the hospital is 24-72 hours. If there were problems with your labor or delivery, or if you have other medical problems, you might be in the hospital longer.  °While you are in the hospital, you will receive help and instructions on how to care for yourself and your newborn during the postpartum period.  °While you are in the hospital: °· Be sure to tell your nurses if you have pain or discomfort, as well as   where you feel the pain and what makes the pain worse. °· If you had an incision made near your vagina (episiotomy) or if you had some tearing during delivery, the nurses may put ice packs on your episiotomy or tear. The ice packs may help to reduce the pain and swelling. °· If you are breastfeeding, you may feel uncomfortable contractions of your uterus for a couple of weeks. This is normal. The contractions help your uterus get back to normal size. °· It is normal to have some bleeding after delivery. °¨ For the first 1-3 days after delivery, the flow is red and the amount may be similar to a period. °¨ It is common for the flow to start and stop. °¨ In the first few days, you may pass some small clots. Let your nurses know if you begin to pass large clots or your flow increases. °¨ Do not  flush blood clots down the toilet before having the nurse look at them. °¨ During the next 3-10 days after delivery, your flow should become more watery and pink or brown-tinged in color. °¨ Ten to fourteen days after delivery, your flow should be a small amount of yellowish-white discharge. °¨ The amount of your flow will decrease over the first few weeks after delivery. Your flow may stop in 6-8 weeks. Most women have had their flow stop by 12 weeks after delivery. °· You should change your sanitary pads frequently. °· Wash your hands thoroughly with soap and water for at least 20 seconds after changing pads, using the toilet, or before holding or feeding your newborn. °· You should  feel like you need to empty your bladder within the first 6-8 hours after delivery. °· In case you become weak, lightheaded, or faint, call your nurse before you get out of bed for the first time and before you take a shower for the first time. °· Within the first few days after delivery, your breasts may begin to feel tender and full. This is called engorgement. Breast tenderness usually goes away within 48-72 hours after engorgement occurs. You may also notice milk leaking from your breasts. If you are not breastfeeding, do not stimulate your breasts. Breast stimulation can make your breasts produce more milk. °· Spending as much time as possible with your newborn is very important. During this time, you and your newborn can feel close and get to know each other. Having your newborn stay in your room (rooming in) will help to strengthen the bond with your newborn.  It will give you time to get to know your newborn and become comfortable caring for your newborn. °· Your hormones change after delivery. Sometimes the hormone changes can temporarily cause you to feel sad or tearful. These feelings should not last more than a few days. If these feelings last longer than that, you should talk to your caregiver. °· If desired, talk to your caregiver about methods of family planning or contraception. °· Talk to your caregiver about immunizations. Your caregiver may want you to have the following immunizations before leaving the hospital: °¨ Tetanus, diphtheria, and pertussis (Tdap) or tetanus and diphtheria (Td) immunization. It is very important that you and your family (including grandparents) or others caring for your newborn are up-to-date with the Tdap or Td immunizations. The Tdap or Td immunization can help protect your newborn from getting ill. °¨ Rubella immunization. °¨ Varicella (chickenpox) immunization. °¨ Influenza immunization. You should receive this annual immunization if you did not receive the    immunization during your pregnancy. °  °This information is not intended to replace advice given to you by your health care provider. Make sure you discuss any questions you have with your health care provider. °  °Document Released: 02/26/2007 Document Revised: 01/24/2012 Document Reviewed: 12/27/2011 °[employer]vier Interactive Patient Education ©2016 [employer] °Breastfeeding and Mastitis °Mastitis is inflammation of the breast tissue. It can occur in women who are breastfeeding. This can make breastfeeding painful. Mastitis will sometimes go away on its own. Your health care provider will help determine if treatment is needed. °CAUSES °Mastitis is often associated with a blocked milk (lactiferous) duct. This can happen when too much milk builds up in the breast. Causes of excess milk in the breast can include: °· Poor latch-on. If your baby is not latched onto the breast properly, she or he may not empty your breast completely while breastfeeding. °· Allowing too much time to pass between feedings. °· Wearing a bra or other clothing that is too tight. This puts extra pressure on the lactiferous ducts so milk does not flow through them as it should. °Mastitis can also be caused by a bacterial infection. Bacteria may enter the breast tissue through cuts or openings in the skin. In women who are breastfeeding, this may occur because of cracked or irritated skin. Cracks in the skin are often caused when your baby does not latch on properly to the breast. °SIGNS AND SYMPTOMS °· Swelling, redness, tenderness, and pain in an area of the breast. °· Swelling of the glands under the arm on the same side. °· Fever may or may not accompany mastitis. °If an infection is allowed to progress, a collection of pus (abscess) may develop. °DIAGNOSIS  °Your health care provider can usually diagnose mastitis based on your symptoms and a physical exam. Tests may be done to help confirm the diagnosis. These may include: °· Removal of pus  from the breast by applying pressure to the area. This pus can be examined in the lab to determine which bacteria are present. If an abscess has developed, the fluid in the abscess can be removed with a needle. This can also be used to confirm the diagnosis and determine the bacteria present. In most cases, pus will not be present. °· Blood tests to determine if your body is fighting a bacterial infection. °· Mammogram or ultrasound tests to rule out other problems or diseases. °TREATMENT  °Mastitis that occurs with breastfeeding will sometimes go away on its own. Your health care provider may choose to wait 24 hours after first seeing you to decide whether a prescription medicine is needed. If your symptoms are worse after 24 hours, your health care provider will likely prescribe an antibiotic medicine to treat the mastitis. He or she will determine which bacteria are most likely causing the infection and will then select an appropriate antibiotic medicine. This is sometimes changed based on the results of tests performed to identify the bacteria, or if there is no response to the antibiotic medicine selected. Antibiotic medicines are usually given by mouth. You may also be given medicine for pain. °HOME CARE INSTRUCTIONS °· Only take over-the-counter or prescription medicines for pain, fever, or discomfort as directed by your health care provider. °· If your health care provider prescribed an antibiotic medicine, take the medicine as directed. Make sure you finish it even if you start to feel better. °· Do not wear a tight or underwire bra. Wear a soft, supportive bra. °· Increase your fluid   intake, especially if you have a fever. °· Continue to empty the breast. Your health care provider can tell you whether this milk is safe for your infant or needs to be thrown out. You may be told to stop nursing until your health care provider thinks it is safe for your baby. Use a breast pump if you are advised to stop  nursing. °· Keep your nipples clean and dry. °· Empty the first breast completely before going to the other breast. If your baby is not emptying your breasts completely for some reason, use a breast pump to empty your breasts. °· If you go back to work, pump your breasts while at work to stay in time with your nursing schedule. °· Avoid allowing your breasts to become overly filled with milk (engorged). °SEEK MEDICAL CARE IF: °· You have pus-like discharge from the breast. °· Your symptoms do not improve with the treatment prescribed by your health care provider within 2 days. °SEEK IMMEDIATE MEDICAL CARE IF: °· Your pain and swelling are getting worse. °· You have pain that is not controlled with medicine. °· You have a red line extending from the breast toward your armpit. °· You have a fever or persistent symptoms for more than 2-3 days. °· You have a fever and your symptoms suddenly get worse. °MAKE SURE YOU:  °· Understand these instructions. °· Will watch your condition. °· Will get help right away if you are not doing well or get worse. °  °This information is not intended to replace advice given to you by your health care provider. Make sure you discuss any questions you have with your health care provider. °  °Document Released: 08/26/2004 Document Revised: 05/06/2013 Document Reviewed: 12/05/2012 °[employer]vier Interactive Patient Education ©2016 [employer] ° °Breastfeeding °Deciding to breastfeed is one of the best choices you can make for you and your baby. A change in hormones during pregnancy causes your breast tissue to grow and increases the number and size of your milk ducts. These hormones also allow proteins, sugars, and fats from your blood supply to make breast milk in your milk-producing glands. Hormones prevent breast milk from being released before your baby is born as well as prompt milk flow after birth. Once breastfeeding has begun, thoughts of your baby, as well as his or her sucking or  crying, can stimulate the release of milk from your milk-producing glands.  °BENEFITS OF BREASTFEEDING °For Your Baby °· Your first milk (colostrum) helps your baby's digestive system function better. °· There are antibodies in your milk that help your baby fight off infections. °· Your baby has a lower incidence of asthma, allergies, and sudden infant death syndrome. °· The nutrients in breast milk are better for your baby than infant formulas and are designed uniquely for your baby's needs. °· Breast milk improves your baby's brain development. °· Your baby is less likely to develop other conditions, such as childhood obesity, asthma, or type 2 diabetes mellitus. °For You °· Breastfeeding helps to create a very special bond between you and your baby. °· Breastfeeding is convenient. Breast milk is always available at the correct temperature and costs nothing. °· Breastfeeding helps to burn calories and helps you lose the weight gained during pregnancy. °· Breastfeeding makes your uterus contract to its prepregnancy size faster and slows bleeding (lochia) after you give birth.   °· Breastfeeding helps to lower your risk of developing type 2 diabetes mellitus, osteoporosis, and breast or ovarian cancer later in life. °  SIGNS THAT YOUR BABY IS HUNGRY °Early Signs of Hunger °· Increased alertness or activity. °· Stretching. °· Movement of the head from side to side. °· Movement of the head and opening of the mouth when the corner of the mouth or cheek is stroked (rooting). °· Increased sucking sounds, smacking lips, cooing, sighing, or squeaking. °· Hand-to-mouth movements. °· Increased sucking of fingers or hands. °Late Signs of Hunger °· Fussing. °· Intermittent crying. °Extreme Signs of Hunger °Signs of extreme hunger will require calming and consoling before your baby will be able to breastfeed successfully. Do not wait for the following signs of extreme hunger to occur before you initiate  breastfeeding: °· Restlessness. °· A loud, strong cry. °· Screaming. °BREASTFEEDING BASICS °Breastfeeding Initiation °· Find a comfortable place to sit or lie down, with your neck and back well supported. °· Place a pillow or rolled up blanket under your baby to bring him or her to the level of your breast (if you are seated). Nursing pillows are specially designed to help support your arms and your baby while you breastfeed. °· Make sure that your baby's abdomen is facing your abdomen. °· Gently massage your breast. With your fingertips, massage from your chest wall toward your nipple in a circular motion. This encourages milk flow. You may need to continue this action during the feeding if your milk flows slowly. °· Support your breast with 4 fingers underneath and your thumb above your nipple. Make sure your fingers are well away from your nipple and your baby's mouth. °· Stroke your baby's lips gently with your finger or nipple. °· When your baby's mouth is open wide enough, quickly bring your baby to your breast, placing your entire nipple and as much of the colored area around your nipple (areola) as possible into your baby's mouth. °¨ More areola should be visible above your baby's upper lip than below the lower lip. °¨ Your baby's tongue should be between his or her lower gum and your breast. °· Ensure that your baby's mouth is correctly positioned around your nipple (latched). Your baby's lips should create a seal on your breast and be turned out (everted). °· It is common for your baby to suck about 2-3 minutes in order to start the flow of breast milk. °Latching °Teaching your baby how to latch on to your breast properly is very important. An improper latch can cause nipple pain and decreased milk supply for you and poor weight gain in your baby. Also, if your baby is not latched onto your nipple properly, he or she may swallow some air during feeding. This can make your baby fussy. Burping your baby when  you switch breasts during the feeding can help to get rid of the air. However, teaching your baby to latch on properly is still the best way to prevent fussiness from swallowing air while breastfeeding. °Signs that your baby has successfully latched on to your nipple: °· Silent tugging or silent sucking, without causing you pain. °· Swallowing heard between every 3-4 sucks. °· Muscle movement above and in front of his or her ears while sucking. °Signs that your baby has not successfully latched on to nipple: °· Sucking sounds or smacking sounds from your baby while breastfeeding. °· Nipple pain. °If you think your baby has not latched on correctly, slip your finger into the corner of your baby's mouth to break the suction and place it between your baby's gums. Attempt breastfeeding initiation again. °Signs of Successful Breastfeeding °  Signs from your baby: °· A gradual decrease in the number of sucks or complete cessation of sucking. °· Falling asleep. °· Relaxation of his or her body. °· Retention of a small amount of milk in his or her mouth. °· Letting go of your breast by himself or herself. °Signs from you: °· Breasts that have increased in firmness, weight, and size 1-3 hours after feeding. °· Breasts that are softer immediately after breastfeeding. °· Increased milk volume, as well as a change in milk consistency and color by the fifth day of breastfeeding. °· Nipples that are not sore, cracked, or bleeding. °Signs That Your Baby is Getting Enough Milk °· Wetting at least 3 diapers in a 24-hour period. The urine should be clear and pale yellow by age 5 days. °· At least 3 stools in a 24-hour period by age 5 days. The stool should be soft and yellow. °· At least 3 stools in a 24-hour period by age 7 days. The stool should be seedy and yellow. °· No loss of weight greater than 10% of birth weight during the first 3 days of age. °· Average weight gain of 4-7 ounces (113-198 g) per week after age 4  days. °· Consistent daily weight gain by age 5 days, without weight loss after the age of 2 weeks. °After a feeding, your baby may spit up a small amount. This is common. °BREASTFEEDING FREQUENCY AND DURATION °Frequent feeding will help you make more milk and can prevent sore nipples and breast engorgement. Breastfeed when you feel the need to reduce the fullness of your breasts or when your baby shows signs of hunger. This is called "breastfeeding on demand." Avoid introducing a pacifier to your baby while you are working to establish breastfeeding (the first 4-6 weeks after your baby is born). After this time you may choose to use a pacifier. Research has shown that pacifier use during the first year of a baby's life decreases the risk of sudden infant death syndrome (SIDS). °Allow your baby to feed on each breast as long as he or she wants. Breastfeed until your baby is finished feeding. When your baby unlatches or falls asleep while feeding from the first breast, offer the second breast. Because newborns are often sleepy in the first few weeks of life, you may need to awaken your baby to get him or her to feed. °Breastfeeding times will vary from baby to baby. However, the following rules can serve as a guide to help you ensure that your baby is properly fed: °· Newborns (babies 4 weeks of age or younger) may breastfeed every 1-3 hours. °· Newborns should not go longer than 3 hours during the day or 5 hours during the night without breastfeeding. °· You should breastfeed your baby a minimum of 8 times in a 24-hour period until you begin to introduce solid foods to your baby at around 6 months of age. °BREAST MILK PUMPING °Pumping and storing breast milk allows you to ensure that your baby is exclusively fed your breast milk, even at times when you are unable to breastfeed. This is especially important if you are going back to work while you are still breastfeeding or when you are not able to be present during  feedings. Your lactation consultant can give you guidelines on how long it is safe to store breast milk. °A breast pump is a machine that allows you to pump milk from your breast into a sterile bottle. The pumped breast milk can   then be stored in a refrigerator or freezer. Some breast pumps are operated by hand, while others use electricity. Ask your lactation consultant which type will work best for you. Breast pumps can be purchased, but some hospitals and breastfeeding support groups lease breast pumps on a monthly basis. A lactation consultant can teach you how to hand express breast milk, if you prefer not to use a pump. °CARING FOR YOUR BREASTS WHILE YOU BREASTFEED °Nipples can become dry, cracked, and sore while breastfeeding. The following recommendations can help keep your breasts moisturized and healthy: °· Avoid using soap on your nipples. °· Wear a supportive bra. Although not required, special nursing bras and tank tops are designed to allow access to your breasts for breastfeeding without taking off your entire bra or top. Avoid wearing underwire-style bras or extremely tight bras. °· Air dry your nipples for 3-4 minutes after each feeding. °· Use only cotton bra pads to absorb leaked breast milk. Leaking of breast milk between feedings is normal. °· Use lanolin on your nipples after breastfeeding. Lanolin helps to maintain your skin's normal moisture barrier. If you use pure lanolin, you do not need to wash it off before feeding your baby again. Pure lanolin is not toxic to your baby. You may also hand express a few drops of breast milk and gently massage that milk into your nipples and allow the milk to air dry. °In the first few weeks after giving birth, some women experience extremely full breasts (engorgement). Engorgement can make your breasts feel heavy, warm, and tender to the touch. Engorgement peaks within 3-5 days after you give birth. The following recommendations can help ease  engorgement: °· Completely empty your breasts while breastfeeding or pumping. You may want to start by applying warm, moist heat (in the shower or with warm water-soaked hand towels) just before feeding or pumping. This increases circulation and helps the milk flow. If your baby does not completely empty your breasts while breastfeeding, pump any extra milk after he or she is finished. °· Wear a snug bra (nursing or regular) or tank top for 1-2 days to signal your body to slightly decrease milk production. °· Apply ice packs to your breasts, unless this is too uncomfortable for you. °· Make sure that your baby is latched on and positioned properly while breastfeeding. °If engorgement persists after 48 hours of following these recommendations, contact your health care provider or a lactation consultant. °OVERALL HEALTH CARE RECOMMENDATIONS WHILE BREASTFEEDING °· Eat healthy foods. Alternate between meals and snacks, eating 3 of each per day. Because what you eat affects your breast milk, some of the foods may make your baby more irritable than usual. Avoid eating these foods if you are sure that they are negatively affecting your baby. °· Drink milk, fruit juice, and water to satisfy your thirst (about 10 glasses a day). °· Rest often, relax, and continue to take your prenatal vitamins to prevent fatigue, stress, and anemia. °· Continue breast self-awareness checks. °· Avoid chewing and smoking tobacco. Chemicals from cigarettes that pass into breast milk and exposure to secondhand smoke may harm your baby. °· Avoid alcohol and drug use, including marijuana. °Some medicines that may be harmful to your baby can pass through breast milk. It is important to ask your health care provider before taking any medicine, including all over-the-counter and prescription medicine as well as vitamin and herbal supplements. °It is possible to become pregnant while breastfeeding. If birth control is desired, ask your health care    provider about options that will be safe for your baby. °SEEK MEDICAL CARE IF: °· You feel like you want to stop breastfeeding or have become frustrated with breastfeeding. °· You have painful breasts or nipples. °· Your nipples are cracked or bleeding. °· Your breasts are red, tender, or warm. °· You have a swollen area on either breast. °· You have a fever or chills. °· You have nausea or vomiting. °· You have drainage other than breast milk from your nipples. °· Your breasts do not become full before feedings by the fifth day after you give birth. °· You feel sad and depressed. °· Your baby is too sleepy to eat well. °· Your baby is having trouble sleeping.   °· Your baby is wetting less than 3 diapers in a 24-hour period. °· Your baby has less than 3 stools in a 24-hour period. °· Your baby's skin or the white part of his or her eyes becomes yellow.   °· Your baby is not gaining weight by 5 days of age. °SEEK IMMEDIATE MEDICAL CARE IF: °· Your baby is overly tired (lethargic) and does not want to wake up and feed. °· Your baby develops an unexplained fever. °  °This information is not intended to replace advice given to you by your health care provider. Make sure you discuss any questions you have with your health care provider. °  °Document Released: 05/01/2005 Document Revised: 01/20/2015 Document Reviewed: 10/23/2012 °Elsevier Interactive Patient Education ©2016 [employer] ° °

## 2015-06-05 NOTE — Lactation Note (Signed)
This note was copied from the chart of Kim Rodriguez. Lactation Consultation Note Mom sobbing, hormones, being tired, cluster feeding, upset d/t not going home, and wanting to use pacifier. Explained about nipple confusion and using energy on pacifier and not nursing can cause nipple confusion. Mom wearing nipple shield. Colostrum noted in NS after feeding. Encouraged mom to walk in hall for exercise, had a shower, and  Encouraged to nap.  Patient Name: Kim Shantrice Rodenberg [id number] Date: 06/05/2015 Reason for consult: Follow-up assessment   Maternal Data    Feeding Feeding Type: Breast Fed Length of feed: 30 min  LATCH Score/Interventions Latch: Repeated attempts needed to sustain latch, nipple held in mouth throughout feeding, stimulation needed to elicit sucking reflex. Intervention(s): Teach feeding cues;Waking techniques Intervention(s): Adjust position  Audible Swallowing: Spontaneous and intermittent  Type of Nipple: Everted at rest and after stimulation Intervention(s): Shells;Double electric pump;Hand pump  Comfort (Breast/Nipple): Filling, red/small blisters or bruises, mild/mod discomfort Intervention(s): Hand expression Intervention(s): Double electric pump;Expressed breast milk to nipple  Interventions (Filling): Double electric pump;Massage Interventions (Mild/moderate discomfort): Comfort gels;Breast shields Interventions (Severe discomfort): Double electric pum  Hold (Positioning): Assistance needed to correctly position infant at breast and maintain latch.  LATCH Score: 7  Lactation Tools Discussed/Used Tools: Shells;Nipple Shields;Pump;Comfort gels Nipple shield size: 16;20 Shell Type: Inverted Breast pump type: Double-Electric Breast Pump   Consult Status Consult Status: Follow-up Date: 06/06/15 Follow-up type: In-patient    Charyl Dancer 06/05/2015, 4:04 PM

## 2015-06-06 ENCOUNTER — Ambulatory Visit: Payer: Self-pay

## 2015-06-06 NOTE — Lactation Note (Signed)
This note was copied from the chart of Kim Rodriguez. Lactation Consultation Note  Patient Name: Kim Kim Rodriguez GUYQI'[id number] Date: 06/06/2015 Reason for consult: Follow-up assessment;Infant weight loss   Patient Name: Kim Kim Rodriguez Today's Date: 06/06/2015 Reason for consult: Follow-up assessment;Infant weight loss;Difficult latch   Follow up with parents prior to d/c. Infant awake and alert after MD assessment. Infant was re-weighed and gained 50 grams since 12 MN. Mom latched infant to right breast using a NS, infant latched with flanged lips and rhythmic swallows, milk was seen in NS. Infant re-weighed with no gain after right side. Infant sleepy at breast and needed stimulation to maintain suckling. Infant then placed to left breast in football hold without NS. He latched easily with rhythmic suckles and increased swallows. He nursed about 5 minutes and then asleep again. He gained 5 gm post feed on left side. Infant reawakened at that time, mom latched to right breast in football hold, he was sleepy and did not sustain latch. Mom was massaging/compressing breasts with feeding. Mom's nipple to right side is thicker and less compressible and not as elastic as the the left nipple. Dr. Ezequiel Essex talked to parents about clipping tongue-tie, mom declined at this time and wants to wait. We discussed at length anterior/posterior/labial ties and the effect on BF and supply. Parents are aware that the infant needs close follow up due to NS use. Infant with anterior tongue tie noted, he is not able to elevate tongue to roof of mouth and extend it out over gumline while sucking on gloved finger. I talked with mom about using SNS and she declined at this time. Dad finished feeding using finger feeding and syringe with EBM, infant took 5 cc and fell asleep. Parents are aware that infant should be getting 30 cc supplement of EBM/formula on day of life 3 and to increase supplement to 40 cc tomorrow and ad lib the  following day, supplement should be given 8 X a day. Infant should be BF 8-12 x in 24 hours. Mom is to attempt without NS first and the add NS if needed, feeding should be at least 15 minutes and then followed by pumping for 15 minutes and hand expression. Infant has follow up appt for tomorrow, They have a f/u LC appt on Friday 1/27 @ 9 am. Enc mom to call with questions/concerns.     Maternal Data Formula Feeding for Exclusion: No Does the patient have breastfeeding experience prior to this delivery?: No  Feeding Feeding Type: Breast Fed Length of feed: 15 min  LATCH Score/Interventions Latch: Repeated attempts needed to sustain latch, nipple held in mouth throughout feeding, stimulation needed to elicit sucking reflex. Intervention(s): Skin to skin;Waking techniques Intervention(s): Breast compression;Breast massage  Audible Swallowing: Spontaneous and intermittent (increased swallows without NS) Intervention(s): Skin to skin Intervention(s): Skin to skin;Hand expression;Alternate breast massage  Type of Nipple: Flat Intervention(s): Hand pump;Double electric pump  Comfort (Breast/Nipple): Filling, red/small blisters or bruises, mild/mod discomfort Intervention(s): Expressed breast milk to nipple  Problem noted: Mild/Moderate discomfort Interventions  (Cracked/bleeding/bruising/blister): Expressed breast milk to nipple Interventions (Mild/moderate discomfort): Post-pump  Hold (Positioning): No assistance needed to correctly position infant at breast. Intervention(s): Breastfeeding basics reviewed;Support Pillows;Position options;Skin to skin  LATCH Score: 7  Lactation Tools Discussed/Used Tools: Nipple Shields;42F feeding tube / Syringe Breast pump type: Manual WIC Program: No Pump Review: Setup, frequency, and cleaning;Milk Storage   Consult Status Consult Status: Follow-up Date: 06/11/15 Follow-up type: Out-patient    Kim Rodriguez  Kim Rodriguez 06/06/2015, 11:21 AM

## 2015-06-06 NOTE — Lactation Note (Signed)
This note was copied from the chart of Kim Rodriguez. Lactation Consultation Note  Patient Name: Kim Rodriguez [id number] Date: 06/06/2015 Reason for consult: Follow-up assessment;Infant weight loss   Follow up with mom of 64 hour old infant. Infant with 12 BF for 10-30 minutes, EBM supplementation x 3 (3-14 cc), Formula supplementation x 3 (15-30 cc) 2 voids and 1 stool in last 24 hours. Infant weight 7 lb 8.5 oz with a weight loss of 10 %. Mom is using a NS with all feedings, she reports her nipples are healing and that she is using comfort gels between feedings.  Mom is pumping every 3 hours using DEBP and milk volume is increasing. Mom reports the infant is sleepy at the breast for some feedings and others he is more vigorous. He is being fed supplementation via finger feeding after each feeding now and does well with that. Infant is reported to have a tongue tie, he was asleep and I did not assess tongue. LATCH scores 6-7 by bedside RN's. Offered to set up SNS for mom and she feels that will be too much at this time. Infant has a f/u appt tomorrow with Dr. Arn Medal. Mom is agreeable to making an OP LC appointment for f/u. Discussed with mom that NS should be a temporary tool and that we should work towards getting infant off NS as soon as possible. Mom has a list of tongue tie resources and plans to discuss with Ped tomorrow. Parents to call for assessment with next feeding.   Maternal Data Formula Feeding for Exclusion: No Does the patient have breastfeeding experience prior to this delivery?: No  Feeding Feeding Type: Breast Fed Length of feed: 19 min  LATCH Score/Interventions                      Lactation Tools Discussed/Used Tools: Nipple Shields Nipple shield size: 20 WIC Program: No Pump Review: Setup, frequency, and cleaning;Milk Storage   Consult Status Date: 06/06/15 Follow-up type: In-patient    Kim Rodriguez 06/06/2015, 8:40 AM

## 2015-06-11 ENCOUNTER — Ambulatory Visit (HOSPITAL_COMMUNITY)
Admission: RE | Admit: 2015-06-11 | Discharge: 2015-06-11 | Disposition: A | Discharge status: Home / Self Care | Payer: 59 | Source: Ambulatory Visit | Attending: Obstetrics and Gynecology | Admitting: Obstetrics and Gynecology

## 2015-06-11 NOTE — Lactation Note (Signed)
Lactation Consult  Mother's reason for visit:  Needs help with breastfeeding,  Visit Type:  Feeding assessment Appointment Notes:  Tongue tie, NS Consult:  Initial Lactation Consultant:  Pamelia Hoit  ________________________________________________________________________  Baby's Name: [name] Date of Birth: 06/03/2013 Pediatrician: [facility] Gender: female Gestational Age: [redacted]w[redacted]d (At Birth) Birth Weight: 8 lb 5.5 oz (3785 g) Weight at Discharge: Weight: 7 lb 10.2 oz (3464 g)Date of Discharge: 06/06/2015 [facility] Weights   06/04/15 2330 06/05/15 2330 06/06/15 0950  Weight: 7 lb 13.8 oz (3565 g) 7 lb 8.5 oz (3415 g) 7 lb 10.2 oz (3464 g)   Last weight taken from location outside of [facility]: 7-12.5  Location:Ped office yesterday  Weight today: 7- 12.2  3520g     ________________________________________________________________________  Mother's Name: Kim Rodriguez   Breastfeeding Experience:  P1    ________________________________________________________________________  Breastfeeding History (Post Discharge)  Frequency of breastfeeding:  q 2-3  Duration of feeding:  30-45 min  Supplementation  Formula:  Volume None since Wednesday when milk came in       Breastmilk:  Volume 15-20 ml Frequency:  q feeding  Method:  Syringe,   Pumping  Type of pump:  Medela pump in style Frequency:  q feeding Volume:  50- 70 ml  Infant Intake and Output Assessment  Voids:  6 in 24 hrs.  Color:  Clear yellow Stools:  2 in 24 hrs.  Color:  Yellow  ________________________________________________________________________  Maternal Breast Assessment  Breast:  Filling Nipple:  Flat- pink  _______________________________________________________________________ Feeding Assessment/Evaluation  Initial feeding assessment:  Infant's oral assessment:  Variance  Positioning:  Cross cradle Left breast  LATCH  documentation:  Latch:  2 = Grasps breast easily, tongue down, lips flanged, rhythmical sucking.  Audible swallowing:  1 = A few with stimulation  Type of nipple:  1 = Flat  Comfort (Breast/Nipple):  1 = Filling, red/small blisters or bruises, mild/mod discomfort  Hold (Positioning):  1 = Assistance needed to correctly position infant at breast and maintain latch  LATCH score:  6  Attached assessment:  Deep  Lips flanged:  Yes.    Lips untucked:  No.  Suck assessment:  Displays both  Tools:  Nipple shield 16 mm Instructed on use and cleaning of tool:  Yes.    Pre-feed weight:  3520 g  7-12.2 Post-feed weight:  3532 g 7- 12.6 Amount transferred:  12 ml  Calvin latched with NS. Mom did not want to try without NS. She states it hurts and she gets blisters from it. Mature milk noted in NS when he came off the breast. Nursed for 20 min   Pre-feed weight:  3532g 7- 12.6 Post-feed weight:  3548 g  7- 13.2  Amount transferred:  16 ml Amount supplemented:  30 ml    Calvin nursed for 15 min on right breast with NS. Getting sleepy. Discussed tongue tie with parents. They states Ped told them it was not a problem because he can extend his tongue past gums. Discussed milk intake and need to continue supplementing for weight gain. Discussed bottle feeding since he is taking more volume and parents are agreeable. Reviewed bottle feeding with parents and dad fed 30 mls of EBM to baby.No questions at present. To see Ped next Thursday. Offered another OP appointment here but they want to go home and see Ped next week. Will call if wants another appointment or with questions   Total amount pumped post feed: 80  ml  Total amount transferred:  28 ml Total supplement given:  30 ml

## 2015-06-21 DIAGNOSIS — O99285 Endocrine, nutritional and metabolic diseases complicating the puerperium: Secondary | ICD-10-CM | POA: Diagnosis not present

## 2015-07-23 DIAGNOSIS — O99285 Endocrine, nutritional and metabolic diseases complicating the puerperium: Secondary | ICD-10-CM | POA: Diagnosis not present

## 2015-08-22 ENCOUNTER — Encounter: Payer: Self-pay | Admitting: Emergency Medicine

## 2015-08-22 ENCOUNTER — Emergency Department
Admission: EM | Admit: 2015-08-22 | Discharge: 2015-08-22 | Disposition: A | Discharge status: Home / Self Care | Payer: 59 | Source: Home / Self Care | Attending: Family Medicine | Admitting: Family Medicine

## 2015-08-22 DIAGNOSIS — J029 Acute pharyngitis, unspecified: Secondary | ICD-10-CM | POA: Diagnosis not present

## 2015-08-22 DIAGNOSIS — R0981 Nasal congestion: Secondary | ICD-10-CM

## 2015-08-22 DIAGNOSIS — J302 Other seasonal allergic rhinitis: Secondary | ICD-10-CM

## 2015-08-22 NOTE — ED Notes (Signed)
[facility]/O sorethroat since yesterday, denies fever, she refused to have throat swabbed

## 2015-08-22 NOTE — ED Provider Notes (Signed)
CSN: [id number]     Arrival date & time 08/22/15  1516 History   First MD Initiated Contact with Patient 08/22/15 1522     Chief Complaint  Patient presents with  . Sore Throat   (Consider location/radiation/quality/duration/timing/severity/associated sxs/prior Treatment) HPI The pt is a 29yo female [facility] with c/o mild to moderate sore scratchy throat with nasal congestion and post-nasal drip since yesterday.  Pt reports mild intermittent cough from the drainage.  Hx of seasonal allergies but she is currently breastfeeding her 66mo old son and has not been taking any daily allergy medication. She did try benadryl last night as well as tylenol and a cough drop but no relief. Her son currently has a cold but no other sick contacts. Denies fever, chills, n/v/d.   Past Medical History  Diagnosis Date  . Asthma   . Blood transfusion without reported diagnosis   . Anxiety   . Depression   . Asthma, mild persistent 05/12/2013  . Anxiety and depression 05/12/2013  . Migraines     aura, once monthly  . Anemia     in the past  . Concussion   . Vaginal Pap smear, abnormal   . Hx of varicella    Past Surgical History  Procedure Laterality Date  . Wisdom tooth extraction  2008  . Lumbar puncture      pt had a blood transfusion in 1991   Family History  Problem Relation Age of Onset  . Skin cancer Mother   . Hyperlipidemia Mother   . Allergies Mother   . Asthma Mother   . Cancer Mother     skin  . Depression Mother   . Rheum arthritis Maternal Aunt   . Stroke Maternal Grandmother   . Irritable bowel syndrome Maternal Grandmother   . Pyelonephritis Maternal Grandmother   . Hypothyroidism Maternal Grandmother   . Arthritis Maternal Grandmother   . Rheumatic fever Maternal Grandmother   . Hypertension Maternal Grandmother   . Depression Maternal Grandmother   . Depression Sister   . Cancer Maternal Grandfather     skin  . Thrombophlebitis Maternal Grandfather   . Heart  attack Paternal Grandmother    Social History  Substance Use Topics  . Smoking status: Never Smoker   . Smokeless tobacco: Never Used  . Alcohol Use: No   OB History    Gravida Para Term Preterm AB TAB SAB Ectopic Multiple Living   1 1 1       0 1     Review of Systems  Constitutional: Negative for fever and chills.  HENT: Positive for congestion, postnasal drip, rhinorrhea and sore throat. Negative for ear pain, trouble swallowing and voice change.   Respiratory: Positive for cough ( minimal). Negative for shortness of breath.   Gastrointestinal: Negative for nausea, vomiting, abdominal pain and diarrhea.    Allergies  Dilaudid; Tamiflu; Influenza vaccines; and Sulfa antibiotics  Home Medications   Prior to Admission medications   Medication Sig Start Date End Date Taking? Authorizing Provider  acetaminophen (TYLENOL) 325 MG tablet Take 325 mg by mouth every 6 (six) hours as needed for mild pain.    Historical Provider, MD  albuterol (PROVENTIL HFA;VENTOLIN HFA) 108 (90 Base) MCG/ACT inhaler Inhale 2 puffs into the lungs every 6 (six) hours as needed for wheezing or shortness of breath.    Historical Provider, MD  ibuprofen (ADVIL,MOTRIN) 600 MG tablet Take 1 tablet (600 mg total) by mouth every 6 (six) hours. 06/05/15  Raelyn Mora, CNM  iron polysaccharides (NIFEREX) 150 MG capsule Take 1 capsule (150 mg total) by mouth 2 (two) times daily. 06/05/15   Rolitta Arita Miss, CNM  LORazepam (ATIVAN) 2 MG tablet Take 1 tablet (2 mg total) by mouth 2 (two) times daily as needed for anxiety. 06/05/15   Raelyn Mora, CNM  magnesium oxide (MAG-OX) 400 (241.3 Mg) MG tablet Take 0.5 tablets (200 mg total) by mouth daily. 06/05/15   Raelyn Mora, CNM  Prenatal Vit-Min-FA-Fish Oil ([employer] PRENATAL GUMMY PO) Take 2 tablets by mouth daily.    Historical Provider, MD   Meds Ordered and Administered this Visit  Medications - No data to display  BP 133/83 mmHg  Pulse 106  Temp(Src) 98.6 F (37 C)  (Oral)  Resp 16  Ht  (1.727 m)  Wt 166 lb (75.297 kg)  BMI 25.25 kg/m2  SpO2 98%  Breastfeeding? Yes No data found.   Physical Exam  Constitutional: She appears well-developed and well-nourished. No distress.  HENT:  Head: Normocephalic and atraumatic.  Right Ear: Tympanic membrane normal.  Left Ear: Tympanic membrane normal.  Nose: Rhinorrhea present. Right sinus exhibits no maxillary sinus tenderness and no frontal sinus tenderness. Left sinus exhibits no maxillary sinus tenderness and no frontal sinus tenderness.  Mouth/Throat: Uvula is midline and mucous membranes are normal. Posterior oropharyngeal erythema present. No oropharyngeal exudate, posterior oropharyngeal edema or tonsillar abscesses.  Eyes: Conjunctivae are normal. No scleral icterus.  Neck: Normal range of motion. Neck supple.  Cardiovascular: Normal rate, regular rhythm and normal heart sounds.   Pulmonary/Chest: Effort normal and breath sounds normal. No stridor. No respiratory distress. She has no wheezes. She has no rales.  Abdominal: Soft. She exhibits no distension. There is no tenderness.  Musculoskeletal: Normal range of motion.  Lymphadenopathy:    She has no cervical adenopathy.  Neurological: She is alert.  Skin: Skin is warm and dry. She is not diaphoretic.  Nursing note and vitals reviewed.   ED Course  Procedures (including critical care time)  Labs Review Labs Reviewed - No data to display  Imaging Review No results found.    MDM   1. Sore throat   2. Sinus congestion   3. Seasonal allergies     Pt c/o sore throat, post-nasal drip, and mild cough since yesterday. 24mo old son also sick with a cold.  Pt is afebrile. No evidence of peritonsillar abscess.   Per centor criteria, pt unlikely to have strep throat.   Symptoms likely viral in nature. Encouraged salt water gargles and acetaminophen for pain. F/u with PCP in 1 week if not improving, sooner if worsening. Patient verbalized  understanding and agreement with treatment plan.     Junius Finner, PA-C 08/22/15 1542

## 2015-08-25 ENCOUNTER — Telehealth: Payer: 59 | Admitting: Nurse Practitioner

## 2015-08-25 DIAGNOSIS — J0101 Acute recurrent maxillary sinusitis: Secondary | ICD-10-CM | POA: Diagnosis not present

## 2015-08-25 MED ORDER — CEPHALEXIN 500 MG PO CAPS: 500.0000 mg | ORAL_CAPSULE | Freq: Three times a day (TID) | ORAL | Status: DC

## 2015-08-25 NOTE — Progress Notes (Signed)
We are sorry that you are not feeling well.  Here is how we plan to help!  Based on what you have shared with me it looks like you have sinusitis.  Sinusitis is inflammation and infection in the sinus cavities of the head.  Based on your presentation I believe you most likely have Acute Bacterial sinusitis.  This is an infection caused by bacteria and is treated with antibiotics.  I have prescribed Keflex, an antibiotic in the cephalosporin family, to be taken as directed.  You may use an oral decongestant such as Mucinex D or if you have glaucoma or high blood pressure use plain Mucinex.  Saline nasal sprays help an can sefely be used as often as needed for congestion.  If you develop worsening sinus pain, fever or notice severe headache and vision changes, or if symptoms are not better after completion of antibiotic, please schedule an appointment with a health care provider.  Sinus infections are not as easily transmitted as other respiratory infection, however we still recommend that you avoid close contact with loved ones, especially the very young and elderly.  Remember to wash your hands thoroughly throughout the day as this is the number one way to prevent the spread of infection!  Home Care:  Only take medications as instructed by your medical team.  Complete the entire course of an antibiotic.  Do not take these medications with alcohol.  A steam or ultrasonic humidifier can help congestion.  You can place a towel over your head and breathe in the steam from hot water coming from a faucet.  Avoid close contacts especially the very young and the elderly.  Cover your mouth when you cough or sneeze.  Always remember to wash your hands.  Get Help Right Away If:  You develop worsening fever or sinus pain.  You develop a severe head ache or visual changes.  Your symptoms persist after you have completed your treatment plan.  Make sure you  Understand these instructions.  Will  watch your condition.  Will get help right away if you are not doing well or get worse.  Your e-visit answers were reviewed by a board certified advanced clinical practitioner to complete your personal care plan.  Depending on the condition, your plan could have included both over the counter or prescription medications.  If there is a problem please reply  once you have received a response from your provider.  Your safety is important to us.  If you have drug allergies check your prescription carefully.    You can use MyChart to ask questions about today's visit, request a non-urgent call back, or ask for a work or school excuse.  You will get an e-mail in the next two days asking about your experience.  I hope that your e-visit has been valuable and will speed your recovery. Thank you for using e-visits.

## 2015-09-12 DIAGNOSIS — R319 Hematuria, unspecified: Secondary | ICD-10-CM | POA: Diagnosis not present

## 2015-09-12 DIAGNOSIS — Z882 Allergy status to sulfonamides status: Secondary | ICD-10-CM | POA: Diagnosis not present

## 2015-09-12 DIAGNOSIS — N202 Calculus of kidney with calculus of ureter: Secondary | ICD-10-CM | POA: Diagnosis not present

## 2015-09-12 DIAGNOSIS — N201 Calculus of ureter: Secondary | ICD-10-CM | POA: Diagnosis not present

## 2015-09-12 DIAGNOSIS — R110 Nausea: Secondary | ICD-10-CM | POA: Diagnosis not present

## 2015-09-12 DIAGNOSIS — R1032 Left lower quadrant pain: Secondary | ICD-10-CM | POA: Diagnosis not present

## 2015-09-12 DIAGNOSIS — Z888 Allergy status to other drugs, medicaments and biological substances status: Secondary | ICD-10-CM | POA: Diagnosis not present

## 2015-09-12 DIAGNOSIS — Z887 Allergy status to serum and vaccine status: Secondary | ICD-10-CM | POA: Diagnosis not present

## 2015-09-12 DIAGNOSIS — Z886 Allergy status to analgesic agent status: Secondary | ICD-10-CM | POA: Diagnosis not present

## 2015-09-13 DIAGNOSIS — N200 Calculus of kidney: Secondary | ICD-10-CM | POA: Diagnosis not present

## 2015-09-13 DIAGNOSIS — N202 Calculus of kidney with calculus of ureter: Secondary | ICD-10-CM | POA: Diagnosis not present

## 2015-09-13 DIAGNOSIS — K5900 Constipation, unspecified: Secondary | ICD-10-CM | POA: Diagnosis not present

## 2015-09-15 DIAGNOSIS — N201 Calculus of ureter: Secondary | ICD-10-CM | POA: Diagnosis not present

## 2015-10-01 DIAGNOSIS — N2889 Other specified disorders of kidney and ureter: Secondary | ICD-10-CM | POA: Diagnosis not present

## 2015-10-01 DIAGNOSIS — N200 Calculus of kidney: Secondary | ICD-10-CM | POA: Diagnosis not present

## 2015-12-16 DIAGNOSIS — H01111 Allergic dermatitis of right upper eyelid: Secondary | ICD-10-CM | POA: Diagnosis not present

## 2016-07-14 ENCOUNTER — Emergency Department
Admission: EM | Admit: 2016-07-14 | Discharge: 2016-07-14 | Disposition: A | Discharge status: Home / Self Care | Payer: 59 | Source: Home / Self Care | Attending: Family Medicine | Admitting: Family Medicine

## 2016-07-14 ENCOUNTER — Emergency Department (INDEPENDENT_AMBULATORY_CARE_PROVIDER_SITE_OTHER): Payer: 59

## 2016-07-14 ENCOUNTER — Encounter: Payer: Self-pay | Admitting: Emergency Medicine

## 2016-07-14 DIAGNOSIS — R0781 Pleurodynia: Secondary | ICD-10-CM | POA: Diagnosis not present

## 2016-07-14 NOTE — ED Provider Notes (Signed)
[facility]    CSN: [id number] Arrival date & time: 07/14/16  1633     History   Chief Complaint Chief Complaint  Patient presents with  . Chest Pain    HPI Kim Rodriguez is a 31 y.o. female.   Patient reports that she has had a persistent mild cough for about a month but did not feel ill.  Today while in her car, she reached up with her right hand to press the garage door opener, and heard a painful pop in her right anterior chest.  She denies shortness of breath.   The history is provided by the patient.  Chest Pain  Pain location:  R chest Pain quality: sharp   Pain radiates to:  Does not radiate Pain severity:  Moderate Onset quality:  Sudden Duration:  2 hours Timing:  Constant Progression:  Unchanged Chronicity:  New Context: breathing, movement and raising an arm   Relieved by:  None tried Worsened by:  Coughing, deep breathing and certain positions Ineffective treatments:  None tried Associated symptoms: cough   Associated symptoms: no abdominal pain, no back pain, no diaphoresis, no fatigue, no fever, no lower extremity edema, no nausea, no palpitations and no shortness of breath     Past Medical History:  Diagnosis Date  . Anemia    in the past  . Anxiety   . Anxiety and depression 05/12/2013  . Asthma   . Asthma, mild persistent 05/12/2013  . Blood transfusion without reported diagnosis   . Concussion   . Depression   . Hx of varicella   . Migraines    aura, once monthly  . Vaginal Pap smear, abnormal     Patient Active Problem List   Diagnosis Date Noted  . Post-dates pregnancy 06/03/2015  . Postpartum care following vaginal delivery (1/19) 06/03/2015  . Preseptal cellulitis of right eye 07/03/2013  . Asthma, mild persistent 05/12/2013  . Anxiety and depression 05/12/2013    Past Surgical History:  Procedure Laterality Date  . LUMBAR PUNCTURE     pt had a blood transfusion in 1991  . WISDOM TOOTH EXTRACTION  2008    OB  History    Gravida Para Term Preterm AB Living   1 1 1     1    SAB TAB Ectopic Multiple Live Births         0 1       Home Medications    Prior to Admission medications   Medication Sig Start Date End Date Taking? Authorizing Provider  escitalopram (LEXAPRO) 10 MG tablet Take 10 mg by mouth daily.   Yes Historical Provider, MD  acetaminophen (TYLENOL) 325 MG tablet Take 325 mg by mouth every 6 (six) hours as needed for mild pain.    Historical Provider, MD  albuterol (PROVENTIL HFA;VENTOLIN HFA) 108 (90 Base) MCG/ACT inhaler Inhale 2 puffs into the lungs every 6 (six) hours as needed for wheezing or shortness of breath.    Historical Provider, MD  ibuprofen (ADVIL,MOTRIN) 600 MG tablet Take 1 tablet (600 mg total) by mouth every 6 (six) hours. 06/05/15   Raelyn Mora, CNM  iron polysaccharides (NIFEREX) 150 MG capsule Take 1 capsule (150 mg total) by mouth 2 (two) times daily. 06/05/15   Rolitta Arita Miss, CNM  LORazepam (ATIVAN) 2 MG tablet Take 1 tablet (2 mg total) by mouth 2 (two) times daily as needed for anxiety. 06/05/15   Raelyn Mora, CNM  magnesium oxide (MAG-OX) 400 (241.3 Mg) MG  tablet Take 0.5 tablets (200 mg total) by mouth daily. 06/05/15   Raelyn Mora, CNM  Prenatal Vit-Min-FA-Fish Oil ([employer] PRENATAL GUMMY PO) Take 2 tablets by mouth daily.    Historical Provider, MD    Family History Family History  Problem Relation Age of Onset  . Skin cancer Mother   . Hyperlipidemia Mother   . Allergies Mother   . Asthma Mother   . Cancer Mother     skin  . Depression Mother   . Rheum arthritis Maternal Aunt   . Stroke Maternal Grandmother   . Irritable bowel syndrome Maternal Grandmother   . Pyelonephritis Maternal Grandmother   . Hypothyroidism Maternal Grandmother   . Arthritis Maternal Grandmother   . Rheumatic fever Maternal Grandmother   . Hypertension Maternal Grandmother   . Depression Maternal Grandmother   . Depression Sister   . Cancer Maternal Grandfather      skin  . Thrombophlebitis Maternal Grandfather   . Heart attack Paternal Grandmother     Social History Social History  Substance Use Topics  . Smoking status: Never Smoker  . Smokeless tobacco: Never Used  . Alcohol use No     Allergies   Dilaudid [hydromorphone hcl]; Tamiflu [oseltamivir]; Influenza vaccines; and Sulfa antibiotics   Review of Systems Review of Systems  Constitutional: Negative for diaphoresis, fatigue and fever.  Respiratory: Positive for cough. Negative for shortness of breath.   Cardiovascular: Positive for chest pain. Negative for palpitations.  Gastrointestinal: Negative for abdominal pain and nausea.  Musculoskeletal: Negative for back pain.  All other systems reviewed and are negative.    Physical Exam Triage Vital Signs ED Triage Vitals  Enc Vitals Group     BP 07/14/16 1657 113/87     Pulse Rate 07/14/16 1657 83     Resp --      Temp 07/14/16 1657 97.8 F (36.6 C)     Temp Source 07/14/16 1657 Oral     SpO2 07/14/16 1657 97 %     Weight 07/14/16 1658 175 lb (79.4 kg)     Height 07/14/16 1658 5\' 6"  (1.676 m)     Head Circumference --      Peak Flow --      Pain Score 07/14/16 1703 9     Pain Loc --      Pain Edu? --      Excl. in GC? --    No data found.   Updated Vital Signs BP 113/87 (BP Location: Left Arm)   Pulse 83   Temp 97.8 F (36.6 C) (Oral)   Ht 5\' 6"  (1.676 m)   Wt 175 lb (79.4 kg)   LMP 07/03/2016   SpO2 97%   BMI 28.25 kg/m   Visual Acuity Right Eye Distance:   Left Eye Distance:   Bilateral Distance:    Right Eye Near:   Left Eye Near:    Bilateral Near:     Physical Exam  Constitutional: She appears well-developed and well-nourished. No distress.  HENT:  Head: Normocephalic.  Right Ear: External ear normal.  Left Ear: External ear normal.  Nose: Nose normal.  Mouth/Throat: Oropharynx is clear and moist.  Eyes: Conjunctivae are normal. Pupils are equal, round, and reactive to light.  Neck: Normal  range of motion.  Cardiovascular: Normal heart sounds.   Pulmonary/Chest: Breath sounds normal. No respiratory distress. She has no wheezes. She has no rales. She exhibits tenderness and bony tenderness. She exhibits no crepitus and no swelling.  There is distinct tenderness to palpation right anterior/inferior chest as noted on diagram.   Abdominal: Soft. There is no tenderness.  Musculoskeletal: She exhibits no edema.  Neurological: She is alert.  Skin: Skin is warm and dry. No rash noted.  Nursing note and vitals reviewed.    UC Treatments / Results  Labs (all labs ordered are listed, but only abnormal results are displayed) Labs Reviewed - No data to display  EKG  EKG Interpretation None       Radiology Dg Ribs Unilateral W/chest Right  Result Date: 07/14/2016 CLINICAL DATA:  Pop in the right chest. Right rib pain. Initial encounter. EXAM: RIGHT RIBS AND CHEST - 3+ VIEW COMPARISON:  None. FINDINGS: No fracture or other bone lesions are seen involving the ribs. There is no evidence of pneumothorax or pleural effusion. Both lungs are clear. Heart size and mediastinal contours are within normal limits. Upper thoracic levoscoliosis measuring in the 20% range. IMPRESSION: 1. Negative right ribs.  No evidence of intrathoracic disease. 2. Upper thoracic levoscoliosis. Electronically Signed   By: Marnee SpringJonathon  Watts M.D.   On: 07/14/2016 17:39    Procedures Procedures (including critical care time)  Medications Ordered in UC Medications - No data to display   Initial Impression / Assessment and Plan / UC Course  I have reviewed the triage vital signs and the nursing notes.  Pertinent labs & imaging results that were available during my care of the patient were reviewed by me and considered in my medical decision making (see chart for details).    No evidence rib fracture Dispensed rib belt. Wear rib belt daytime.  May take Ibuprofen 200mg , 4 tabs every 8 hours with food.  If  symptoms become significantly worse during the night or over the weekend, proceed to the local emergency room.     Final Clinical Impressions(s) / UC Diagnoses   Final diagnoses:  Rib pain on right side    New Prescriptions New Prescriptions   No medications on file     Lattie HawStephen A Taiwan Millon, MD 07/17/16 2106

## 2016-07-14 NOTE — ED Triage Notes (Signed)
Right rib pain, she has had a cough for about one month  And has had some right rib pain, however today she was in her car and reached up to press the garage door opener and heard a pop and felt severe pain in right ribcage. Can't take a deep breath, very painful. 9/10

## 2016-07-14 NOTE — Discharge Instructions (Signed)
Wear rib belt daytime.  May take Ibuprofen 200mg , 4 tabs every 8 hours with food.  If symptoms become significantly worse during the night or over the weekend, proceed to the local emergency room.

## 2018-05-29 IMAGING — DX DG RIBS W/ CHEST 3+V*R*
4 series · 4 of 4 positions shown · non-contrast
Comparison: None.

CLINICAL DATA: Pop in the right chest. Right rib pain. Initial
encounter.

EXAM:
RIGHT RIBS AND CHEST - 3+ VIEW

[chest pa]
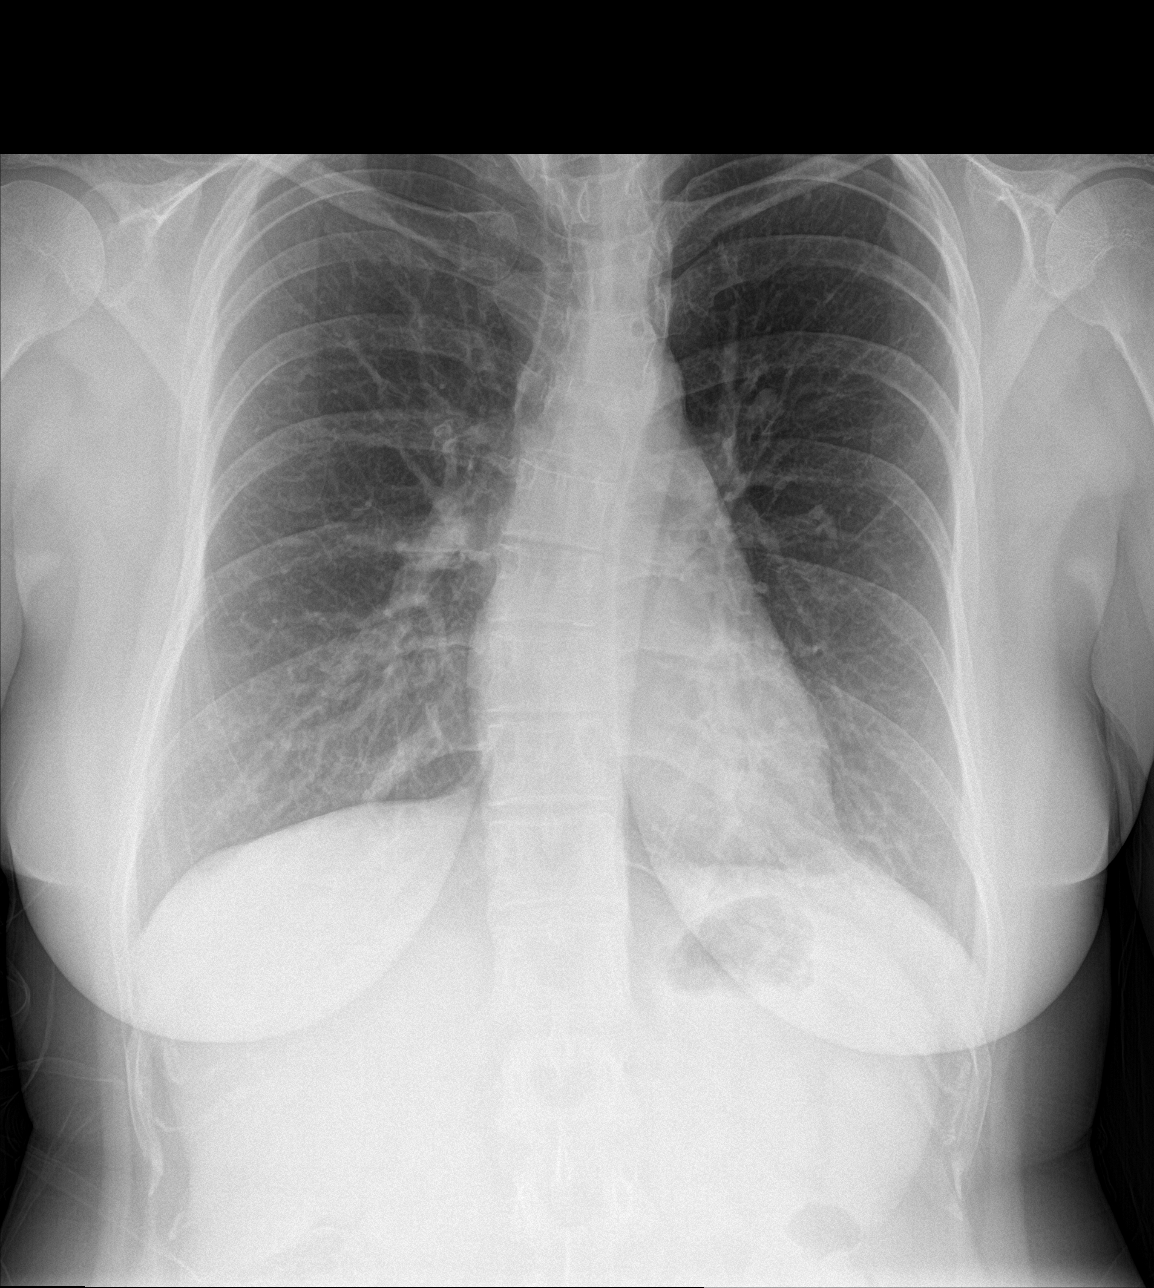

[rib ap (1 of 2)]
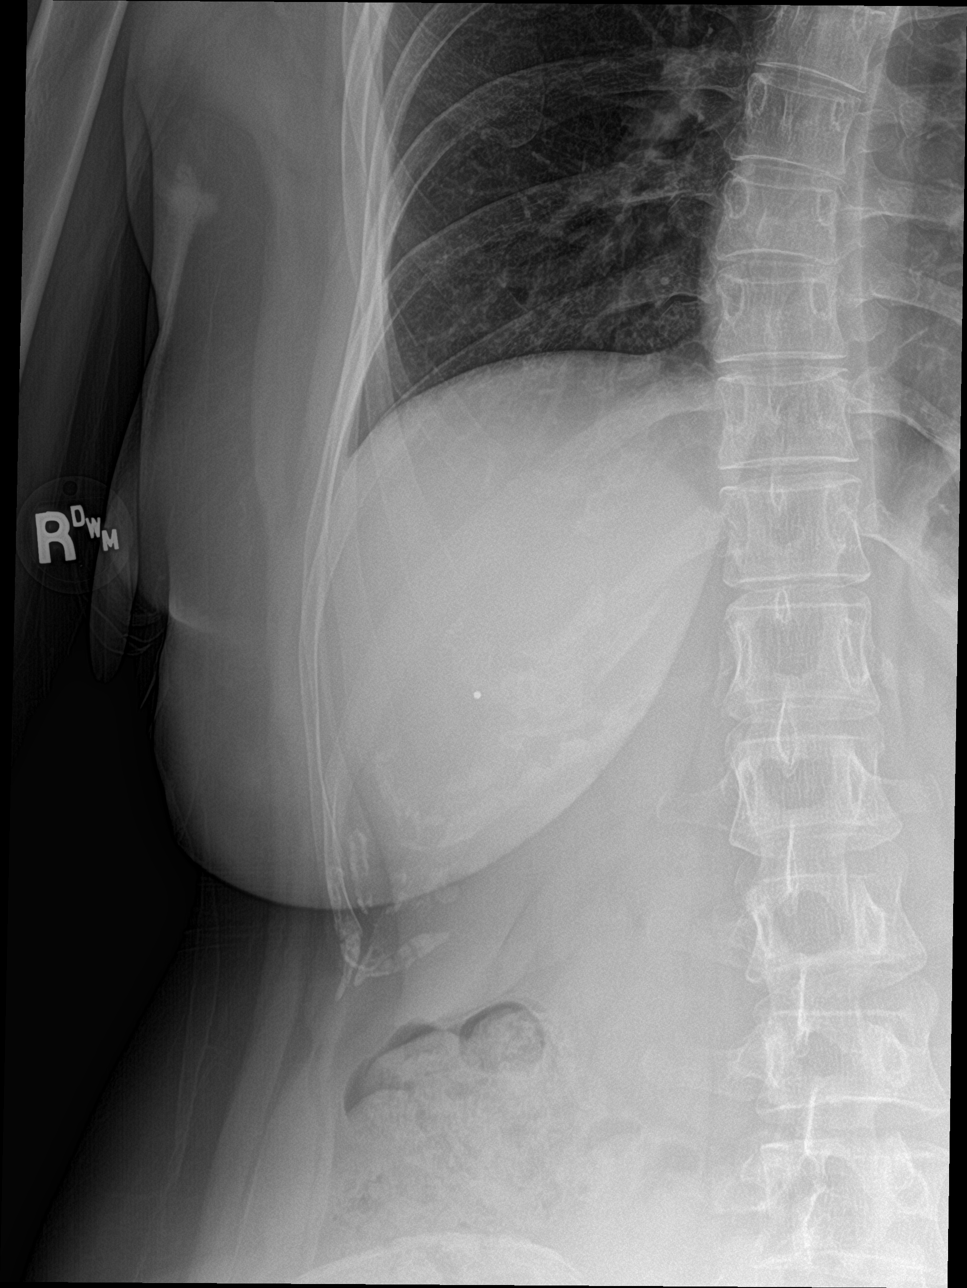

[rib ap (2 of 2)]
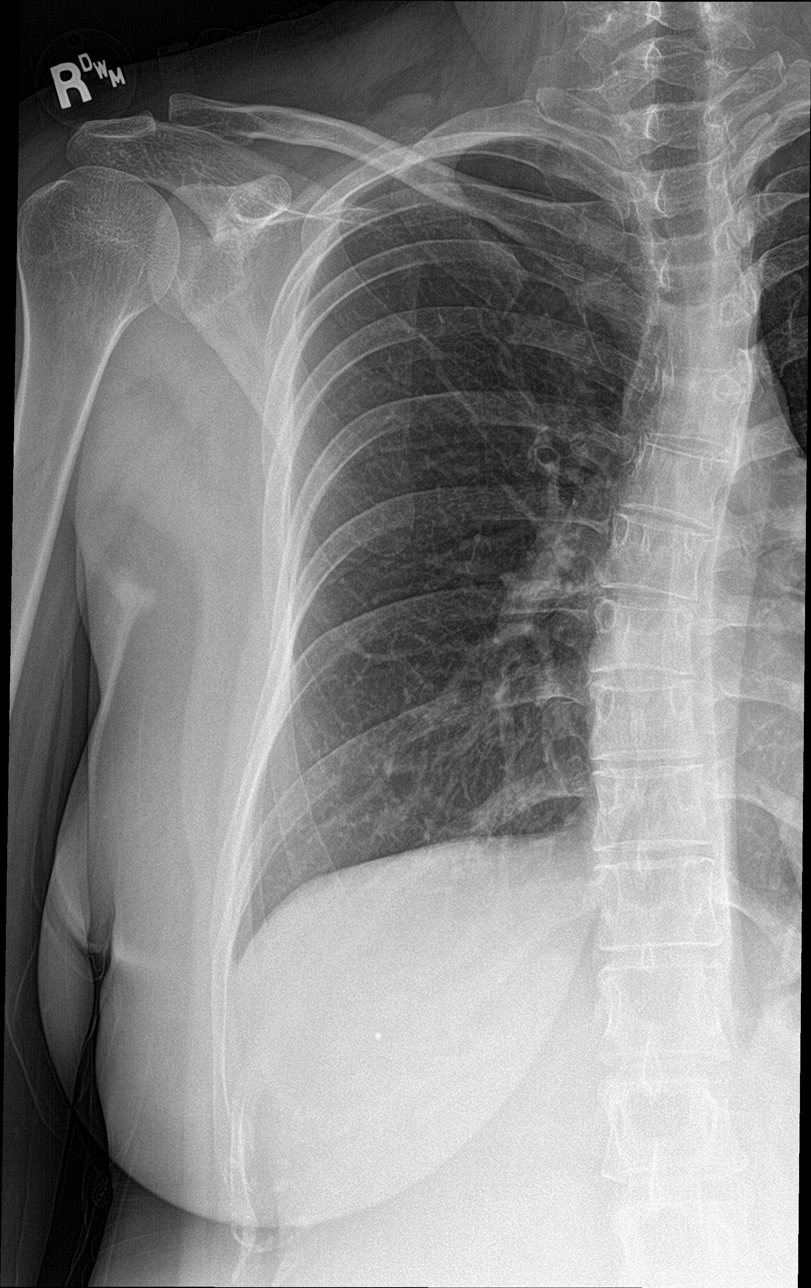

[rib ap obl]
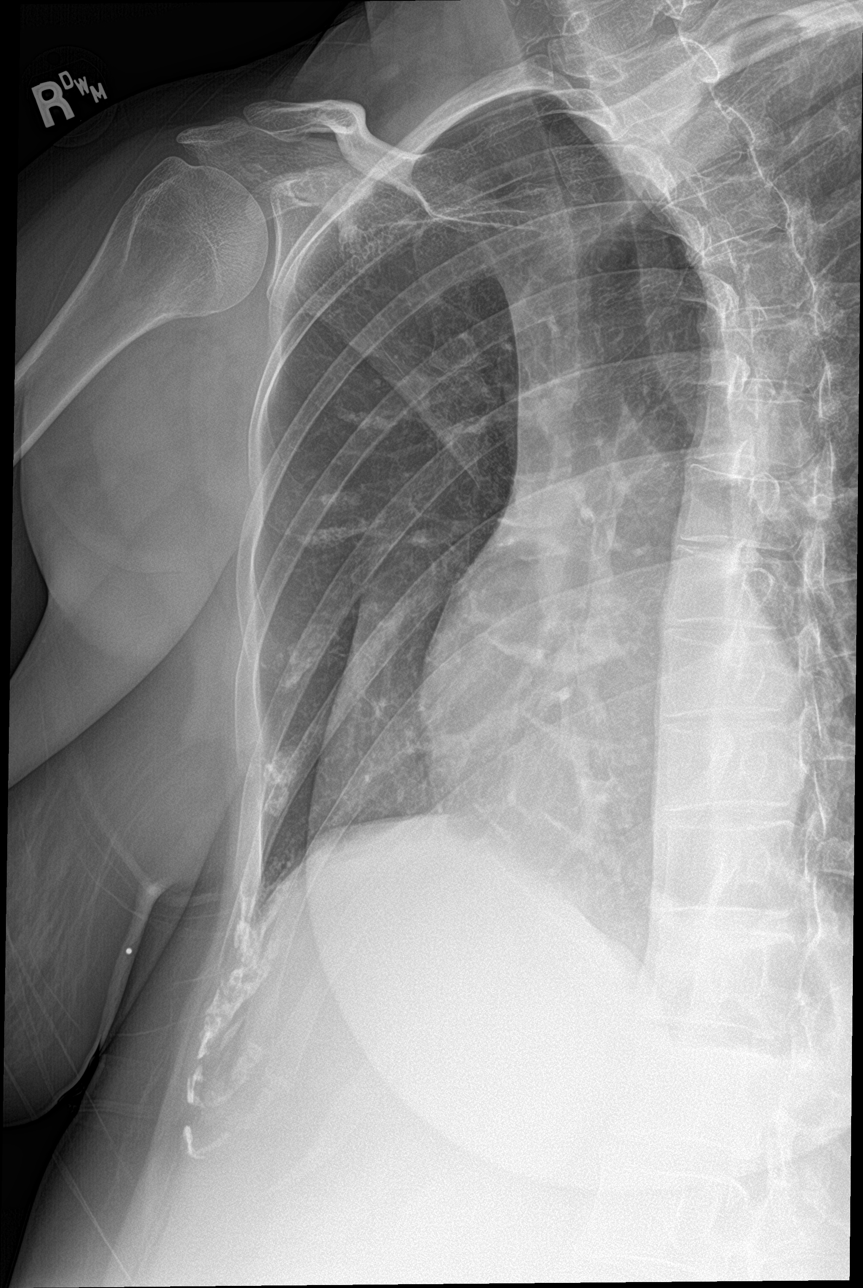

[4 of 4 positions shown; findings below may reference images not displayed]

FINDINGS: No fracture or other bone lesions are seen involving the ribs. There
is no evidence of pneumothorax or pleural effusion. Both lungs are
clear. Heart size and mediastinal contours are within normal limits.

Upper thoracic levoscoliosis measuring in the 20% range.
IMPRESSION: 1. Negative right ribs.  No evidence of intrathoracic disease.
2. Upper thoracic levoscoliosis.

## 2018-12-24 ENCOUNTER — Telehealth: Payer: 59

## 2020-02-16 ENCOUNTER — Emergency Department: Admit: 2020-02-16 | Discharge status: Absent without leave | Payer: Self-pay | Source: Home / Self Care

## 2020-05-16 ENCOUNTER — Other Ambulatory Visit: Payer: Self-pay

## 2020-05-16 ENCOUNTER — Emergency Department (INDEPENDENT_AMBULATORY_CARE_PROVIDER_SITE_OTHER)
Admission: RE | Admit: 2020-05-16 | Discharge: 2020-05-16 | Disposition: A | Discharge status: Home / Self Care | Payer: BC Managed Care – PPO | Source: Ambulatory Visit

## 2020-05-16 VITALS — BP 115/83 | HR 105 | Temp 98.7°F | Resp 20 | Ht 65.0 in | Wt 240.0 lb

## 2020-05-16 DIAGNOSIS — H9203 Otalgia, bilateral: Secondary | ICD-10-CM

## 2020-05-16 DIAGNOSIS — R0981 Nasal congestion: Secondary | ICD-10-CM | POA: Diagnosis not present

## 2020-05-16 DIAGNOSIS — B372 Candidiasis of skin and nail: Secondary | ICD-10-CM

## 2020-05-16 MED ORDER — IPRATROPIUM BROMIDE 0.03 % NA SOLN: 2.0000 | Freq: Four times a day (QID) | NASAL | 0 refills | Status: AC

## 2020-05-16 MED ORDER — CLOTRIMAZOLE-BETAMETHASONE 1-0.05 % EX CREA: TOPICAL_CREAM | CUTANEOUS | 0 refills | Status: AC

## 2020-05-16 MED ORDER — LEVOCETIRIZINE DIHYDROCHLORIDE 5 MG PO TABS: 5.0000 mg | ORAL_TABLET | Freq: Every evening | ORAL | 0 refills | Status: AC

## 2020-05-16 MED ORDER — PREDNISONE 20 MG PO TABS: 40.0000 mg | ORAL_TABLET | Freq: Every day | ORAL | 0 refills | Status: AC

## 2020-05-16 MED ORDER — NYSTATIN 100000 UNIT/GM EX POWD: 1.0000 "application " | Freq: Three times a day (TID) | CUTANEOUS | 0 refills | Status: AC

## 2020-05-16 NOTE — ED Provider Notes (Signed)
[facility]    CSN: [id number] Arrival date & time: 05/16/20  1515      History   Chief Complaint No chief complaint on file.   HPI Kim Rodriguez is a 35 y.o. female.   HPI  Patient presents with URI symptoms including cough, sore throat, otalgia, nasal congestion, runny nose, and sinus pressure. Unknown of COVID exposure. COVID Vaccinated: Y/N Flu vaccinated: Y/N Denies worrisome symptoms of shortness of breath, weakness, N&V, chest pain or leg pain.  Past Medical History:  Diagnosis Date  . Anemia    in the past  . Anxiety   . Anxiety and depression 05/12/2013  . Asthma   . Asthma, mild persistent 05/12/2013  . Blood transfusion without reported diagnosis   . Concussion   . Depression   . Hx of varicella   . Migraines    aura, once monthly  . Vaginal Pap smear, abnormal     Patient Active Problem List   Diagnosis Date Noted  . Post-dates pregnancy 06/03/2015  . Postpartum care following vaginal delivery (1/19) 06/03/2015  . Preseptal cellulitis of right eye 07/03/2013  . Asthma, mild persistent 05/12/2013  . Anxiety and depression 05/12/2013    Past Surgical History:  Procedure Laterality Date  . LUMBAR PUNCTURE     pt had a blood transfusion in 1991  . WISDOM TOOTH EXTRACTION  2008    OB History    Gravida  1   Para  1   Term  1   Preterm      AB      Living  1     SAB      IAB      Ectopic      Multiple  0   Live Births  1            Home Medications    Prior to Admission medications   Medication Sig Start Date End Date Taking? Authorizing Provider  drospirenone-ethinyl estradiol (YAZ) 3-0.02 MG tablet Take 1 tablet by mouth every morning. 01/10/20  Yes [provider]  escitalopram (LEXAPRO) 10 MG tablet  09/11/17  Yes [provider]  acetaminophen (TYLENOL) 325 MG tablet Take 325 mg by mouth every 6 (six) hours as needed for mild pain.    [provider]  albuterol (PROVENTIL  HFA;VENTOLIN HFA) 108 (90 Base) MCG/ACT inhaler Inhale 2 puffs into the lungs every 6 (six) hours as needed for wheezing or shortness of breath.    [provider]  calcium-vitamin D (OSCAL WITH D) 500-200 MG-UNIT TABS tablet Take 1 tablet by mouth every morning.    [provider]  escitalopram (LEXAPRO) 10 MG tablet Take 10 mg by mouth daily.    [provider]  ibuprofen (ADVIL,MOTRIN) 600 MG tablet Take 1 tablet (600 mg total) by mouth every 6 (six) hours. 06/05/15   Laury Deep, CNM  iron polysaccharides (NIFEREX) 150 MG capsule Take 1 capsule (150 mg total) by mouth 2 (two) times daily. 06/05/15   Laury Deep, CNM  LORazepam (ATIVAN) 2 MG tablet Take 1 tablet (2 mg total) by mouth 2 (two) times daily as needed for anxiety. 06/05/15   Laury Deep, CNM  magnesium oxide (MAG-OX) 400 (241.3 Mg) MG tablet Take 0.5 tablets (200 mg total) by mouth daily. 06/05/15   Laury Deep, CNM  Prenatal Vit-Min-FA-Fish Oil ([employer] PRENATAL GUMMY PO) Take 2 tablets by mouth daily.    [provider]  VITAMIN D PO Take by  mouth.    [provider]    Family History Family History  Problem Relation Age of Onset  . Skin cancer Mother   . Hyperlipidemia Mother   . Allergies Mother   . Asthma Mother   . Cancer Mother        skin  . Depression Mother   . Rheum arthritis Maternal Aunt   . Stroke Maternal Grandmother   . Irritable bowel syndrome Maternal Grandmother   . Pyelonephritis Maternal Grandmother   . Hypothyroidism Maternal Grandmother   . Arthritis Maternal Grandmother   . Rheumatic fever Maternal Grandmother   . Hypertension Maternal Grandmother   . Depression Maternal Grandmother   . Depression Sister   . Cancer Maternal Grandfather        skin  . Thrombophlebitis Maternal Grandfather   . Heart attack Paternal Grandmother     Social History Social History   Tobacco Use  . Smoking status: Never Smoker  . Smokeless tobacco: Never  Used  Substance Use Topics  . Alcohol use: No    Alcohol/week: 0.0 standard drinks  . Drug use: No     Allergies   Dilaudid [hydromorphone hcl], Tamiflu [oseltamivir], Influenza vaccines, and Sulfa antibiotics   Review of Systems Review of Systems Pertinent negatives listed in HPI Physical Exam Triage Vital Signs ED Triage Vitals  Enc Vitals Group     BP 05/16/20 1531 115/83     Pulse Rate 05/16/20 1531 (!) 105     Resp 05/16/20 1531 20     Temp 05/16/20 1531 98.7 F (37.1 C)     Temp Source 05/16/20 1531 Oral     SpO2 05/16/20 1531 97 %     Weight 05/16/20 1534 240 lb (108.9 kg)     Height 05/16/20 1534 5\' 5"  (1.651 m)     Head Circumference --      Peak Flow --      Pain Score 05/16/20 1533 0     Pain Loc --      Pain Edu? --      Excl. in GC? --    No data found.  Updated Vital Signs BP 115/83 (BP Location: Left Arm)   Pulse (!) 105   Temp 98.7 F (37.1 C) (Oral)   Resp 20   Ht 5\' 5"  (1.651 m)   Wt 240 lb (108.9 kg)   LMP 04/28/2020 (Approximate)   SpO2 97%   Breastfeeding No   BMI 39.94 kg/m   Visual Acuity Right Eye Distance:   Left Eye Distance:   Bilateral Distance:    Right Eye Near:   Left Eye Near:    Bilateral Near:     Physical Exam  General Appearance:    Alert, cooperative, no distress  HENT:   Normocephalic, ears normal, nares mucosal edema with congestion, rhinorrhea, oropharynx    Eyes:    PERRL, conjunctiva/corneas clear, EOM's intact       Lungs:     Clear to auscultation bilaterally, respirations unlabored  Heart:    Regular rate and rhythm  Neurologic:   Awake, alert, oriented x 3. No apparent focal neurological           defect.      UC Treatments / Results  Labs (all labs ordered are listed, but only abnormal results are displayed) Labs Reviewed - No data to display  EKG   Radiology No results found.  Procedures Procedures (including critical care time)  Medications Ordered in UC Medications -  No data to  display  Initial Impression / Assessment and Plan / UC Course  I have reviewed the triage vital signs and the nursing notes.  Pertinent labs & imaging results that were available during my care of the patient were reviewed by me and considered in my medical decision making (see chart for details).     COVID/Flu test pending. Symptom management warranted only. Treatment per discharge medications.  Intertrigo, nystatin powder for prevention and Lotrisone application for acute rash.Red flag precautions given.  Final Clinical Impressions(s) / UC Diagnoses   Final diagnoses:  Sinus congestion  Otalgia of both ears  Candidiasis, intertrigo   Discharge Instructions   None    ED Prescriptions    Medication Sig Dispense Auth. Provider   predniSONE (DELTASONE) 20 MG tablet Take 2 tablets (40 mg total) by mouth daily with breakfast. 10 tablet Bing Neighbors, FNP   levocetirizine (XYZAL) 5 MG tablet Take 1 tablet (5 mg total) by mouth every evening. 30 tablet Bing Neighbors, FNP   ipratropium (ATROVENT) 0.03 % nasal spray Place 2 sprays into both nostrils 4 (four) times daily. 30 mL Bing Neighbors, FNP   clotrimazole-betamethasone (LOTRISONE) cream Apply to affected area 2 times daily prn 90 g Bing Neighbors, FNP   nystatin (MYCOSTATIN/NYSTOP) powder Apply 1 application topically 3 (three) times daily. 15 g Bing Neighbors, FNP     PDMP not reviewed this encounter.   Bing Neighbors, [place] 05/18/20 [phone]

## 2020-05-16 NOTE — ED Triage Notes (Signed)
Presents with pain pressure in bilateral ears and sinus for 2 days. Pt also reports fatigue and she is fully vaccinated for covid.

## 2020-05-18 ENCOUNTER — Telehealth: Payer: Self-pay | Admitting: Emergency Medicine

## 2020-05-18 NOTE — Telephone Encounter (Signed)
Call for results from COVID & Flu - in process - Kim Rodriguez to check back in My chart later on today or this evening

## 2020-05-19 LAB — COVID-19, FLU A+B NAA
Influenza A, NAA: NOT DETECTED
Influenza B, NAA: NOT DETECTED
SARS-CoV-2, NAA: NOT DETECTED

## 2020-06-07 ENCOUNTER — Other Ambulatory Visit: Payer: Self-pay | Admitting: Family Medicine

## 2020-06-07 NOTE — Telephone Encounter (Signed)
Rx refill request- UC at [facility]
